# Patient Record
Sex: Female | Born: 1955 | Race: Black or African American | Hispanic: No | Marital: Married | Smoking: Never smoker
Health system: Southern US, Community
[De-identification: ages and names within clinical notes are randomized; demographics above are authoritative.]

## PROBLEM LIST (undated history)

## (undated) DIAGNOSIS — E785 Hyperlipidemia, unspecified: Secondary | ICD-10-CM

## (undated) DIAGNOSIS — I776 Arteritis, unspecified: Secondary | ICD-10-CM

## (undated) DIAGNOSIS — N289 Disorder of kidney and ureter, unspecified: Secondary | ICD-10-CM

## (undated) DIAGNOSIS — I10 Essential (primary) hypertension: Secondary | ICD-10-CM

## (undated) DIAGNOSIS — H3551 Vitreoretinal dystrophy: Secondary | ICD-10-CM

## (undated) HISTORY — DX: Hyperlipidemia, unspecified: E78.5

## (undated) HISTORY — PX: COLONOSCOPY: SHX174

## (undated) HISTORY — PX: RENAL BIOPSY: SHX156

## (undated) HISTORY — DX: Vitreoretinal dystrophy: H35.51

---
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
## 2006-09-27 ENCOUNTER — Ambulatory Visit: Payer: Self-pay

## 2006-10-01 ENCOUNTER — Ambulatory Visit: Payer: Self-pay

## 2006-11-13 ENCOUNTER — Ambulatory Visit: Payer: Self-pay | Admitting: Gastroenterology

## 2009-03-01 ENCOUNTER — Emergency Department: Payer: Self-pay | Admitting: Emergency Medicine

## 2011-08-24 ENCOUNTER — Ambulatory Visit: Payer: Self-pay

## 2012-12-18 DIAGNOSIS — N183 Chronic kidney disease, stage 3 unspecified: Secondary | ICD-10-CM | POA: Insufficient documentation

## 2012-12-18 DIAGNOSIS — N182 Chronic kidney disease, stage 2 (mild): Secondary | ICD-10-CM | POA: Insufficient documentation

## 2012-12-18 DIAGNOSIS — I776 Arteritis, unspecified: Secondary | ICD-10-CM | POA: Insufficient documentation

## 2013-10-09 DIAGNOSIS — A6000 Herpesviral infection of urogenital system, unspecified: Secondary | ICD-10-CM | POA: Insufficient documentation

## 2013-10-09 DIAGNOSIS — M317 Microscopic polyangiitis: Secondary | ICD-10-CM | POA: Insufficient documentation

## 2014-08-31 LAB — HM PAP SMEAR

## 2014-08-31 LAB — HM MAMMOGRAPHY

## 2014-09-07 ENCOUNTER — Ambulatory Visit: Payer: Self-pay

## 2015-01-21 ENCOUNTER — Encounter: Payer: Self-pay | Admitting: Emergency Medicine

## 2015-01-21 ENCOUNTER — Telehealth: Payer: Self-pay | Admitting: Unknown Physician Specialty

## 2015-01-21 ENCOUNTER — Ambulatory Visit
Admission: EM | Admit: 2015-01-21 | Discharge: 2015-01-21 | Disposition: A | Discharge status: Home / Self Care | Payer: BC Managed Care – PPO | Attending: Internal Medicine | Admitting: Internal Medicine

## 2015-01-21 DIAGNOSIS — R6884 Jaw pain: Secondary | ICD-10-CM | POA: Diagnosis present

## 2015-01-21 DIAGNOSIS — R531 Weakness: Secondary | ICD-10-CM

## 2015-01-21 DIAGNOSIS — I10 Essential (primary) hypertension: Secondary | ICD-10-CM | POA: Diagnosis not present

## 2015-01-21 DIAGNOSIS — M542 Cervicalgia: Secondary | ICD-10-CM | POA: Diagnosis present

## 2015-01-21 DIAGNOSIS — K219 Gastro-esophageal reflux disease without esophagitis: Secondary | ICD-10-CM | POA: Insufficient documentation

## 2015-01-21 DIAGNOSIS — M25561 Pain in right knee: Secondary | ICD-10-CM | POA: Insufficient documentation

## 2015-01-21 HISTORY — DX: Arteritis, unspecified: I77.6

## 2015-01-21 HISTORY — DX: Essential (primary) hypertension: I10

## 2015-01-21 MED ORDER — GI COCKTAIL ~~LOC~~
30.0000 mL | Freq: Once | ORAL | Status: AC
  Administered 2015-01-21: 30 mL via ORAL

## 2015-01-21 MED ORDER — RANITIDINE HCL 150 MG PO TABS: 150.0000 mg | ORAL_TABLET | Freq: Two times a day (BID) | ORAL | Status: DC

## 2015-01-21 NOTE — Telephone Encounter (Signed)
Pt called asking for appt for today.  Schedule is full so I was not able to give her one.  Pt stated she had pain behind her ear and in her jaw.  I gave patient the options of future appt or Urgent Care today.  She then stated her BP was 175/102, at that point I told her she needed to be evaluated ASAP.  Pt said she would go to ER or Urgent Care immediately.

## 2015-01-21 NOTE — ED Provider Notes (Signed)
CSN: [id number]     Arrival date & time 01/21/15  8938 History   First MD Initiated Contact with Patient 01/21/15 [phone]     Chief Complaint  Patient presents with  . Jaw Pain  . Neck Pain  . Knee Pain    right knee   (Consider location/radiation/quality/duration/timing/severity/associated sxs/prior Treatment) HPI 59 yo F presents reporting right jaw, right neck,substernal discomfort during the night, worse when supine, improved when upright. No Hx GERD. Under a lot of stress at work. Has vasculitis followed by Dr Leslee Home in [facility]- her knees have been bothering her for 2 weeks and she plans to talk with his office today- has not been able to comfortably do her normal exercise  Hx HTN . Woke up in early morning hours and became concerned about chest discomfort/burning - took her usual AM doses of her BP meds and was able to fall back to sleep. Has had no exertional difficulty this AM. No particular chest discomfort though mid-epigastrium tender to push-no SOB -no exertional discomfort changes.  Called PCP this AM and was advised to come here.  Past Medical History  Diagnosis Date  . Hypertension   . Vasculitis    Past Surgical History  Procedure Laterality Date  . Renal biopsy      2 biopsies, benign tumor 1975  . Breast biopsy     Family History  Problem Relation Age of Onset  . Cancer Mother   . Cancer Father   . Kidney disease Maternal Grandfather    History  Substance Use Topics  . Smoking status: Never Smoker   . Smokeless tobacco: Never Used  . Alcohol Use: No   OB History    No data available     Review of Systems Constitutional: No fever.  Eyes: No visual changes. ENT:No sore throat. Cardiovascular:Positive  for substernal chest discomfort/burning Respiratory: Negative for shortness of breath Gastrointestinal: No abdominal pain. No nausea,vomiting, diarrhea Genitourinary: Negative for dysuria. Normal urination. Musculoskeletal: Negative for back pain.  FROM extremities without pain Skin: Negative for rash Neurological: Negative for headache, focal weakness or numbness  Allergies  Review of patient's allergies indicates no known allergies.  Home Medications   Prior to Admission medications   Medication Sig Start Date End Date Taking? Authorizing Provider  amLODipine (NORVASC) 10 MG tablet Take 10 mg by mouth daily.   Yes Historical Provider, MD  furosemide (LASIX) 20 MG tablet Take 20 mg by mouth daily.   Yes Historical Provider, MD  metoprolol tartrate (LOPRESSOR) 25 MG tablet Take 12.5 mg by mouth 2 (two) times daily.   Yes Historical Provider, MD  ramipril (ALTACE) 10 MG capsule Take 10 mg by mouth daily.   Yes Historical Provider, MD  ranitidine (ZANTAC) 150 MG tablet Take 1 tablet (150 mg total) by mouth 2 (two) times daily. 01/21/15   Jan Fireman, PA-C   BP 141/86 mmHg  Pulse 83  Temp(Src) 97 F (36.1 C) (Tympanic)  Resp 16  Ht 5\' 7"  (1.702 m)  Wt 190 lb (86.183 kg)  BMI 29.75 kg/m2  SpO2 99% Physical Exam   Constitutional -alert and oriented,well appearing and in no acute distress.VSS above Head-atraumatic, normocephalic Eyes- conjunctiva normal, EOMI ,conjugate gaze Nose- no congestion or rhinorrhea Mouth/throat- mucous membranes moist Neck- supple without glandular enlargement CV- regular rate, grossly normal heart sounds, no MGR Resp-no distress, normal respiratory effort,clear to auscultation bilaterally R 16 Back-no CVAT, no spinal tenderness GI- soft,non-tender,no distention, mild ML tenderness with deep  palpation mid-epigastric GU- not examined MSK- no tender, normal ROM, all extremities, ambulatory, self-care Neuro- normal speech and language, no gross focal neurological deficit appreciated, no gait instability, Skin-warm,dry ,intact; no rash noted Psych-mood and affect grossly normal; speech and behavior grossly normal ED Course  Procedures (including critical care time) Labs Review Labs Reviewed - No data  to display  Imaging Review No results found.  EKG: normal EKG, normal sinus rhythm, there are no previous tracings available for comparison, sinus bradycardia.  Rate 59   Reviewed by Dr Valere Dross  Medications  gi cocktail (Maalox,Lidocaine,Donnatal) (30 mLs Oral Given 01/21/15 1000)   Patient  had resolution of mid-epigastric discomfort   1. Gastroesophageal reflux disease without esophagitis     Diagnosis and treatment discussed. Dietary adjustment, stress relievers, intervention with H2 Rx at BID and reduce to QD if without symptoms. Recommend f/u with Dr Ulice Dash -call office today.  Questions fielded, expectations and recommendations reviewed. Patient expresses understanding. Will return to [facility] with questions, concern or exacerbation.   Zantac 150 mg BID Rx ( did not drop )     Jan Fireman, PA-C 01/21/15 1103

## 2015-01-21 NOTE — ED Notes (Signed)
Patient states that around 3:00am this morning she started having pain ion the right side of her jaw and pain in her neck and upper chest area.  Patient states that is started to get better after she took her blood pressure medications around 6:30am this morning.  Patient also reports right knee pain going on for the past 2 weeks.  Patient denies injury.  Patient denies SOB or chest pain at this time.

## 2015-01-21 NOTE — Discharge Instructions (Signed)
Gastroesophageal Reflux Disease, Adult  Gastroesophageal reflux disease (GERD) happens when acid from your stomach flows up into the esophagus. When acid comes in contact with the esophagus, the acid causes soreness (inflammation) in the esophagus. Over time, GERD may create small holes (ulcers) in the lining of the esophagus.  CAUSES   · Increased body weight. This puts pressure on the stomach, making acid rise from the stomach into the esophagus.  · Smoking. This increases acid production in the stomach.  · Drinking alcohol. This causes decreased pressure in the lower esophageal sphincter (valve or ring of muscle between the esophagus and stomach), allowing acid from the stomach into the esophagus.  · Late evening meals and a full stomach. This increases pressure and acid production in the stomach.  · A malformed lower esophageal sphincter.  Sometimes, no cause is found.  SYMPTOMS   · Burning pain in the lower part of the mid-chest behind the breastbone and in the mid-stomach area. This may occur twice a week or more often.  · Trouble swallowing.  · Sore throat.  · Dry cough.  · Asthma-like symptoms including chest tightness, shortness of breath, or wheezing.  DIAGNOSIS   Your caregiver may be able to diagnose GERD based on your symptoms. In some cases, X-rays and other tests may be done to check for complications or to check the condition of your stomach and esophagus.  TREATMENT   Your caregiver may recommend over-the-counter or prescription medicines to help decrease acid production. Ask your caregiver before starting or adding any new medicines.   HOME CARE INSTRUCTIONS   · Change the factors that you can control. Ask your caregiver for guidance concerning weight loss, quitting smoking, and alcohol consumption.  · Avoid foods and drinks that make your symptoms worse, such as:  ¨ Caffeine or alcoholic drinks.  ¨ Chocolate.  ¨ Peppermint or mint flavorings.  ¨ Garlic and onions.  ¨ Spicy foods.  ¨ Citrus fruits,  such as oranges, lemons, or limes.  ¨ Tomato-based foods such as sauce, chili, salsa, and pizza.  ¨ Fried and fatty foods.  · Avoid lying down for the 3 hours prior to your bedtime or prior to taking a nap.  · Eat small, frequent meals instead of large meals.  · Wear loose-fitting clothing. Do not wear anything tight around your waist that causes pressure on your stomach.  · Raise the head of your bed 6 to 8 inches with wood blocks to help you sleep. Extra pillows will not help.  · Only take over-the-counter or prescription medicines for pain, discomfort, or fever as directed by your caregiver.  · Do not take aspirin, ibuprofen, or other nonsteroidal anti-inflammatory drugs (NSAIDs).  SEEK IMMEDIATE MEDICAL CARE IF:   · You have pain in your arms, neck, jaw, teeth, or back.  · Your pain increases or changes in intensity or duration.  · You develop nausea, vomiting, or sweating (diaphoresis).  · You develop shortness of breath, or you faint.  · Your vomit is green, yellow, black, or looks like coffee grounds or blood.  · Your stool is red, bloody, or black.  These symptoms could be signs of other problems, such as heart disease, gastric bleeding, or esophageal bleeding.  MAKE SURE YOU:   · Understand these instructions.  · Will watch your condition.  · Will get help right away if you are not doing well or get worse.  Document Released: 04/11/2005 Document Revised: 09/24/2011 Document Reviewed: 01/19/2011  ExitCare® Patient   Information ©2015 [employer]. This information is not intended to replace advice given to you by your health care provider. Make sure you discuss any questions you have with your health care provider.  Food Choices for Gastroesophageal Reflux Disease  When you have gastroesophageal reflux disease (GERD), the foods you eat and your eating habits are very important. Choosing the right foods can help ease the discomfort of GERD.  WHAT GENERAL GUIDELINES DO I NEED TO FOLLOW?  · Choose fruits,  vegetables, whole grains, low-fat dairy products, and low-fat meat, fish, and poultry.  · Limit fats such as oils, salad dressings, butter, nuts, and avocado.  · Keep a food diary to identify foods that cause symptoms.  · Avoid foods that cause reflux. These may be different for different people.  · Eat frequent small meals instead of three large meals each day.  · Eat your meals slowly, in a relaxed setting.  · Limit fried foods.  · Cook foods using methods other than frying.  · Avoid drinking alcohol.  · Avoid drinking large amounts of liquids with your meals.  · Avoid bending over or lying down until 2-3 hours after eating.  WHAT FOODS ARE NOT RECOMMENDED?  The following are some foods and drinks that may worsen your symptoms:  Vegetables  Tomatoes. Tomato juice. Tomato and spaghetti sauce. Chili peppers. Onion and garlic. Horseradish.  Fruits  Oranges, grapefruit, and lemon (fruit and juice).  Meats  High-fat meats, fish, and poultry. This includes hot dogs, ribs, ham, sausage, salami, and bacon.  Dairy  Whole milk and chocolate milk. Sour cream. Cream. Butter. Ice cream. Cream cheese.   Beverages  Coffee and tea, with or without caffeine. Carbonated beverages or energy drinks.  Condiments  Hot sauce. Barbecue sauce.   Sweets/Desserts  Chocolate and cocoa. Donuts. Peppermint and spearmint.  Fats and Oils  High-fat foods, including French fries and potato chips.  Other  Vinegar. Strong spices, such as black pepper, white pepper, red pepper, cayenne, curry powder, cloves, ginger, and chili powder.  The items listed above may not be a complete list of foods and beverages to avoid. Contact your dietitian for more information.  Document Released: 07/02/2005 Document Revised: 07/07/2013 Document Reviewed: 05/06/2013  ExitCare® Patient Information ©2015 [employer]. This information is not intended to replace advice given to you by your health care provider. Make sure you discuss any questions you have with your  health care provider.

## 2015-06-21 ENCOUNTER — Ambulatory Visit (INDEPENDENT_AMBULATORY_CARE_PROVIDER_SITE_OTHER): Payer: BC Managed Care – PPO | Admitting: Unknown Physician Specialty

## 2015-06-21 ENCOUNTER — Encounter: Payer: Self-pay | Admitting: Unknown Physician Specialty

## 2015-06-21 VITALS — BP 137/90 | HR 90 | Temp 97.3°F | Ht 65.7 in | Wt 193.5 lb

## 2015-06-21 DIAGNOSIS — I10 Essential (primary) hypertension: Secondary | ICD-10-CM | POA: Insufficient documentation

## 2015-06-21 DIAGNOSIS — J069 Acute upper respiratory infection, unspecified: Secondary | ICD-10-CM

## 2015-06-21 DIAGNOSIS — M3130 Wegener's granulomatosis without renal involvement: Secondary | ICD-10-CM

## 2015-06-21 DIAGNOSIS — M8580 Other specified disorders of bone density and structure, unspecified site: Secondary | ICD-10-CM

## 2015-06-21 DIAGNOSIS — J309 Allergic rhinitis, unspecified: Secondary | ICD-10-CM | POA: Insufficient documentation

## 2015-06-21 MED ORDER — BENZONATATE 200 MG PO CAPS: 200.0000 mg | ORAL_CAPSULE | Freq: Two times a day (BID) | ORAL | Status: DC | PRN

## 2015-06-21 MED ORDER — AZITHROMYCIN 250 MG PO TABS: ORAL_TABLET | ORAL | Status: DC

## 2015-06-21 NOTE — Progress Notes (Signed)
BP 137/90 mmHg  Pulse 90  Temp(Src) 97.3 F (36.3 C)  Ht 5' 5.7" (1.669 m)  Wt 193 lb 8 oz (87.771 kg)  BMI 31.51 kg/m2  SpO2 96%   Subjective:    Patient ID: Rebekah Mullins, female    DOB: 01/06/1956, 59 y.o.   MRN: [id number]  HPI: Rebekah Mullins is a 59 y.o. female  Chief Complaint  Patient presents with  . Cough    pt states she has a dry cough that makes her chest hurt when she coughs. States she has been getting some headaches as well as dizziness. Patient states all of this started last Friday.   Cough This is a new problem. The current episode started in the past 7 days. The problem has been gradually worsening. The problem occurs constantly. The cough is non-productive. Associated symptoms include chest pain and headaches. Pertinent negatives include no ear congestion, myalgias, nasal congestion or sore throat. Exacerbated by: talking and laughing better with cough drops. auto immune    Relevant past medical, surgical, family and social history reviewed and updated as indicated. Interim medical history since our last visit reviewed. Allergies and medications reviewed and updated.  Review of Systems  HENT: Negative for sore throat.   Respiratory: Positive for cough.   Cardiovascular: Positive for chest pain.  Musculoskeletal: Negative for myalgias.  Neurological: Positive for headaches.    Per HPI unless specifically indicated above     Objective:    BP 137/90 mmHg  Pulse 90  Temp(Src) 97.3 F (36.3 C)  Ht 5' 5.7" (1.669 m)  Wt 193 lb 8 oz (87.771 kg)  BMI 31.51 kg/m2  SpO2 96%  Wt Readings from Last 3 Encounters:  06/21/15 193 lb 8 oz (87.771 kg)  09/07/14 194 lb (87.998 kg)  01/21/15 190 lb (86.183 kg)    Physical Exam  Constitutional: She is oriented to person, place, and time. She appears well-developed and well-nourished. No distress.  HENT:  Head: Normocephalic and atraumatic.  Right Ear: Tympanic membrane and ear canal normal.  Left Ear:  Tympanic membrane and ear canal normal.  Nose: Rhinorrhea present. Right sinus exhibits no maxillary sinus tenderness and no frontal sinus tenderness. Left sinus exhibits no maxillary sinus tenderness and no frontal sinus tenderness.  Mouth/Throat: Mucous membranes are normal. Posterior oropharyngeal erythema present.  Eyes: Conjunctivae and lids are normal. Right eye exhibits no discharge. Left eye exhibits no discharge. No scleral icterus.  Cardiovascular: Normal rate and regular rhythm.   Pulmonary/Chest: Effort normal and breath sounds normal. No respiratory distress.  Abdominal: Normal appearance. There is no splenomegaly or hepatomegaly.  Musculoskeletal: Normal range of motion.  Neurological: She is alert and oriented to person, place, and time.  Skin: Skin is intact. No rash noted. No pallor.  Psychiatric: She has a normal mood and affect. Her behavior is normal. Judgment and thought content normal.    Results for orders placed or performed in visit on 06/21/15  HM MAMMOGRAPHY  Result Value Ref Range   HM Mammogram from PP   HM PAP SMEAR  Result Value Ref Range   HM Pap smear from PP       Assessment & Plan:   Problem List Items Addressed This Visit    None    Visit Diagnoses    URI (upper respiratory infection)    -  Primary    rx for z pack as pt with an auto immune illness    Relevant Medications  azithromycin (ZITHROMAX) 250 MG tablet         Follow up plan: Return if symptoms worsen or fail to improve.

## 2015-07-16 ENCOUNTER — Encounter: Payer: Self-pay | Admitting: Emergency Medicine

## 2015-07-16 ENCOUNTER — Ambulatory Visit
Admission: EM | Admit: 2015-07-16 | Discharge: 2015-07-16 | Disposition: A | Discharge status: Home / Self Care | Payer: BC Managed Care – PPO | Attending: Family Medicine | Admitting: Family Medicine

## 2015-07-16 DIAGNOSIS — J209 Acute bronchitis, unspecified: Secondary | ICD-10-CM

## 2015-07-16 LAB — CBC WITH DIFFERENTIAL/PLATELET
Basophils Absolute: 0.1 10*3/uL (ref 0–0.1)
Basophils Relative: 1 %
Eosinophils Absolute: 0.4 10*3/uL (ref 0–0.7)
Eosinophils Relative: 3 %
HEMATOCRIT: 35.6 % (ref 35.0–47.0)
HEMOGLOBIN: 12 g/dL (ref 12.0–16.0)
LYMPHS ABS: 1.2 10*3/uL (ref 1.0–3.6)
LYMPHS PCT: 8 %
MCH: 33.1 pg (ref 26.0–34.0)
MCHC: 33.8 g/dL (ref 32.0–36.0)
MCV: 97.9 fL (ref 80.0–100.0)
MONO ABS: 0.6 10*3/uL (ref 0.2–0.9)
MONOS PCT: 4 %
NEUTROS ABS: 12.7 10*3/uL — AB (ref 1.4–6.5)
Neutrophils Relative %: 84 %
Platelets: 369 10*3/uL (ref 150–440)
RBC: 3.64 MIL/uL — ABNORMAL LOW (ref 3.80–5.20)
RDW: 11.9 % (ref 11.5–14.5)
WBC: 14.9 10*3/uL — ABNORMAL HIGH (ref 3.6–11.0)

## 2015-07-16 LAB — RAPID INFLUENZA A&B ANTIGENS
Influenza A (ARMC): NOT DETECTED
Influenza B (ARMC): NOT DETECTED

## 2015-07-16 MED ORDER — LEVOFLOXACIN 500 MG PO TABS: 500.0000 mg | ORAL_TABLET | Freq: Every day | ORAL | Status: DC

## 2015-07-16 NOTE — ED Provider Notes (Signed)
Patient presents today with symptoms of cough, nasal congestion, head/sinus pressure. Patient has had the symptoms for 1 month off and on however her symptoms more recently over the last couple of days have gotten worse. She is on her fifth day of azithromycin which was prescribed by her physician. She denies any chest pain, shortness of breath, nausea, vomiting, diarrhea. She has some yellow-green discharge from the nose and with coughing. She denies any photophobia or neck stiffness. She has a past medical history of vasculitis. She admits to normal kidney function.  ROS: Negative except mentioned above.  Vitals as per Epic.  GENERAL: NAD HEENT: mild pharyngeal erythema, no exudate, mild cerumen left ear, nl TM right ear, no cervical LAD, mild frontal sinus tenderness RESP: CTA B CARD: RRR NEURO: CN II-XII grossly intact   A/P:Bronchitis/Sinusitis- flu screen was negative, CBC shows elevated white blood cell count, would recommend changing her antibiotic to Levaquin. She will stop taking her Z-Pak. She is to continue taking cough suppressant like Delsym. She is to take Tylenol/ibuprofen when necessary for fever/pain. If her symptoms do persist or worsen I do recommend that she follow up with her primary care physician next week. Patient addresses understanding of plan.  Paulina Fusi, MD 07/16/15 1330

## 2015-07-16 NOTE — ED Notes (Signed)
Cough, congested, sneezing, head pressure for 1 month.

## 2015-07-19 ENCOUNTER — Emergency Department: Payer: BC Managed Care – PPO

## 2015-07-19 ENCOUNTER — Encounter: Payer: Self-pay | Admitting: Emergency Medicine

## 2015-07-19 DIAGNOSIS — Z792 Long term (current) use of antibiotics: Secondary | ICD-10-CM | POA: Insufficient documentation

## 2015-07-19 DIAGNOSIS — Z79899 Other long term (current) drug therapy: Secondary | ICD-10-CM | POA: Insufficient documentation

## 2015-07-19 DIAGNOSIS — J209 Acute bronchitis, unspecified: Secondary | ICD-10-CM | POA: Insufficient documentation

## 2015-07-19 DIAGNOSIS — M3130 Wegener's granulomatosis without renal involvement: Secondary | ICD-10-CM | POA: Diagnosis not present

## 2015-07-19 DIAGNOSIS — I10 Essential (primary) hypertension: Secondary | ICD-10-CM | POA: Insufficient documentation

## 2015-07-19 DIAGNOSIS — R05 Cough: Secondary | ICD-10-CM | POA: Diagnosis present

## 2015-07-19 LAB — CBC
HCT: 34.2 % — ABNORMAL LOW (ref 35.0–47.0)
HEMOGLOBIN: 11.5 g/dL — AB (ref 12.0–16.0)
MCH: 32.9 pg (ref 26.0–34.0)
MCHC: 33.8 g/dL (ref 32.0–36.0)
MCV: 97.5 fL (ref 80.0–100.0)
Platelets: 387 10*3/uL (ref 150–440)
RBC: 3.5 MIL/uL — ABNORMAL LOW (ref 3.80–5.20)
RDW: 12.1 % (ref 11.5–14.5)
WBC: 13.3 10*3/uL — ABNORMAL HIGH (ref 3.6–11.0)

## 2015-07-19 NOTE — ED Notes (Signed)
Pt to triage via w/c with no distress noted ; pt c/o sharp pain to left side chest that increased with lying supine accomp by nausea, soreness to head/neck/shoulders/arms; denies hx of same; st recently dx for bronchitis and taking levaquin; st some productive cough yellow sputum

## 2015-07-20 ENCOUNTER — Emergency Department: Payer: BC Managed Care – PPO

## 2015-07-20 ENCOUNTER — Emergency Department
Admission: EM | Admit: 2015-07-20 | Discharge: 2015-07-20 | Disposition: A | Discharge status: Home / Self Care | Payer: BC Managed Care – PPO | Attending: Emergency Medicine | Admitting: Emergency Medicine

## 2015-07-20 DIAGNOSIS — M3130 Wegener's granulomatosis without renal involvement: Secondary | ICD-10-CM

## 2015-07-20 DIAGNOSIS — J209 Acute bronchitis, unspecified: Secondary | ICD-10-CM

## 2015-07-20 HISTORY — DX: Disorder of kidney and ureter, unspecified: N28.9

## 2015-07-20 LAB — URINALYSIS COMPLETE WITH MICROSCOPIC (ARMC ONLY)
BILIRUBIN URINE: NEGATIVE
Bacteria, UA: NONE SEEN
GLUCOSE, UA: NEGATIVE mg/dL
Ketones, ur: NEGATIVE mg/dL
NITRITE: NEGATIVE
Protein, ur: 100 mg/dL — AB
Specific Gravity, Urine: 1.021 (ref 1.005–1.030)
pH: 5 (ref 5.0–8.0)

## 2015-07-20 LAB — BASIC METABOLIC PANEL
Anion gap: 9 (ref 5–15)
BUN: 26 mg/dL — AB (ref 6–20)
CHLORIDE: 106 mmol/L (ref 101–111)
CO2: 23 mmol/L (ref 22–32)
CREATININE: 1.49 mg/dL — AB (ref 0.44–1.00)
Calcium: 9.1 mg/dL (ref 8.9–10.3)
GFR calc Af Amer: 43 mL/min — ABNORMAL LOW (ref >60)
GFR calc non Af Amer: 37 mL/min — ABNORMAL LOW (ref >60)
GLUCOSE: 122 mg/dL — AB (ref 65–99)
POTASSIUM: 4.1 mmol/L (ref 3.5–5.1)
SODIUM: 138 mmol/L (ref 135–145)

## 2015-07-20 LAB — TROPONIN I: Troponin I: <0.03 ng/mL (ref <0.031)

## 2015-07-20 LAB — FIBRIN DERIVATIVES D-DIMER (ARMC ONLY): Fibrin derivatives D-dimer (ARMC): 5088 — ABNORMAL HIGH (ref 0–499)

## 2015-07-20 MED ORDER — GUAIFENESIN ER 600 MG PO TB12: 600.0000 mg | ORAL_TABLET | Freq: Two times a day (BID) | ORAL | Status: DC

## 2015-07-20 MED ORDER — IPRATROPIUM-ALBUTEROL 0.5-2.5 (3) MG/3ML IN SOLN
3.0000 mL | Freq: Once | RESPIRATORY_TRACT | Status: AC
  Administered 2015-07-20: 3 mL via RESPIRATORY_TRACT
  Filled 2015-07-20: qty 3

## 2015-07-20 MED ORDER — PREDNISONE 20 MG PO TABS: 60.0000 mg | ORAL_TABLET | Freq: Every day | ORAL | Status: AC

## 2015-07-20 MED ORDER — PREDNISONE 20 MG PO TABS
60.0000 mg | ORAL_TABLET | Freq: Once | ORAL | Status: AC
  Administered 2015-07-20: 60 mg via ORAL
  Filled 2015-07-20: qty 3

## 2015-07-20 MED ORDER — IOHEXOL 350 MG/ML SOLN
100.0000 mL | Freq: Once | INTRAVENOUS | Status: AC | PRN
  Administered 2015-07-20: 80 mL via INTRAVENOUS

## 2015-07-20 MED ORDER — ALBUTEROL SULFATE HFA 108 (90 BASE) MCG/ACT IN AERS: 2.0000 | INHALATION_SPRAY | Freq: Four times a day (QID) | RESPIRATORY_TRACT | Status: DC | PRN

## 2015-07-20 NOTE — Discharge Instructions (Signed)
Granulomatosis With Polyangiitis Granulomatosis with polyangiitis (GPA), also known as Wegener granulomatosis, is a rare disease of the blood vessels. It causes inflammation of small-sized and medium-sized blood vessels in many parts of the body. This inflammation can interfere with the function of organs that are supplied by the blood vessels. Organs that are commonly affected by this condition include the kidneys, lungs, nose, ears, skin, and eyes. The nervous system is also commonly affected. This condition can range from mild to severe, and it can develop gradually or very quickly (acute). In severe cases, the condition can cause serious damage to organs such as the lungs or kidneys. Early treatment can help to prevent damage to the organs that are involved. CAUSES The cause of this condition is not known. It occurs when a person's defense system (immune system) mistakenly attacks the body's own blood vessels, which causes inflammation and damage. This makes GPA an autoimmune disease. RISK FACTORS This condition is more likely to develop in:  People who are older than 61 years of age.  People who are of Caucasian ethnicity. SYMPTOMS Symptoms can vary depending on which part of the body is involved.  General:  Severe tiredness and weakness.  Decreased appetite.  Unintended weight loss.  Fever or sweats.  Joint pain or swelling.  Muscle pain.  Nose:  Nose or face pain.  Runny nose.  Crusts or sores in the nose.  Nosebleeds.  Airway and lungs:  Cough.  Chest pain.  Change in voice.  Wheezing.  Shortness of breath.  Coughing up blood.  Eyes:  Eye pain.  Red eyes.  Vision problems.  Ears:  Hearing problems. Many other parts of the body can be involved. DIAGNOSIS This condition may be diagnosed with:  A physical exam and medical history.  Blood tests.  Urine tests.  Imaging tests, such as X-rays or a CT scan.  Biopsy. A small tissue sample is  taken from the nasal passages, lung, kidney, skin, or other affected areas for examination in a lab. You may be referred to a specialist. This condition can be confused with a number of other rare diseases. TREATMENT  Aggressive treatment is important for this condition and can lead to remission. GPA is usually treated with:  Steroid medicines, such as prednisone.  Other medicines that prevent or slow down your immune system from causing further damage to your blood vessels. You may also be given antibiotic medicines to prevent infection and to help prevent the disease from returning. In severe cases, you may be given medicine directly into a vein through an IV tube. A type of blood transfusion (plasmapheresis) may also be done to remove immune system cells that are attacking your blood vessels. If the kidneys are badly damaged, a kidney transplant may be recommended. Because relapses are common, treatments may need to be adjusted. HOME CARE INSTRUCTIONS  Take over-the-counter and prescription medicines only as told by your health care provider.  If you were prescribed an antibiotic medicine, take it as told by your health care provider. Do not stop taking the antibiotic even if you start to feel better.  Follow instructions from your health care provider about testing for blood in your urine.  Take vitamins and supplements as told by your health care provider or dietitian.  Follow instructions from your health care provider about diet.Try to eat regular, healthy meals, even though some of your treatments might affect your appetite.  Keep all follow-up visits as told by your health care provider. This  is important. SEEK MEDICAL CARE IF:  Your symptoms do not improve in the time expected.  You develop any new symptoms or problems.  You have a fever.  You have nausea or diarrhea.  You develop a rash.  You have a sore throat, white patches in your mouth, or difficulty  swallowing.  You have severe fatigue.  You develop an infection. SEEK IMMEDIATE MEDICAL CARE IF:  You have chest pain.  You feel short of breath.  You feel very light-headed, or you pass out.  You have pain, swelling, or redness anywhere in your legs.  You have blood in your urine.  You have uncontrollable bleeding, such as a nosebleed that will not stop.  You have sudden loss of vision or hearing.  You have decreased urination.   This information is not intended to replace advice given to you by your health care provider. Make sure you discuss any questions you have with your health care provider.   Document Released: 04/29/2007 Document Revised: 03/23/2015 Document Reviewed: 07/19/2014 Elsevier Interactive Patient Education 2016 [employer]  Acute Bronchitis Bronchitis is inflammation of the airways that extend from the windpipe into the lungs (bronchi). The inflammation often causes mucus to develop. This leads to a cough, which is the most common symptom of bronchitis.  In acute bronchitis, the condition usually develops suddenly and goes away over time, usually in a couple weeks. Smoking, allergies, and asthma can make bronchitis worse. Repeated episodes of bronchitis may cause further lung problems.  CAUSES Acute bronchitis is most often caused by the same virus that causes a cold. The virus can spread from person to person (contagious) through coughing, sneezing, and touching contaminated objects. SIGNS AND SYMPTOMS   Cough.   Fever.   Coughing up mucus.   Body aches.   Chest congestion.   Chills.   Shortness of breath.   Sore throat.  DIAGNOSIS  Acute bronchitis is usually diagnosed through a physical exam. Your health care provider will also ask you questions about your medical history. Tests, such as chest X-rays, are sometimes done to rule out other conditions.  TREATMENT  Acute bronchitis usually goes away in a couple weeks. Oftentimes, no  medical treatment is necessary. Medicines are sometimes given for relief of fever or cough. Antibiotic medicines are usually not needed but may be prescribed in certain situations. In some cases, an inhaler may be recommended to help reduce shortness of breath and control the cough. A cool mist vaporizer may also be used to help thin bronchial secretions and make it easier to clear the chest.  HOME CARE INSTRUCTIONS  Get plenty of rest.   Drink enough fluids to keep your urine clear or pale yellow (unless you have a medical condition that requires fluid restriction). Increasing fluids may help thin your respiratory secretions (sputum) and reduce chest congestion, and it will prevent dehydration.   Take medicines only as directed by your health care provider.  If you were prescribed an antibiotic medicine, finish it all even if you start to feel better.  Avoid smoking and secondhand smoke. Exposure to cigarette smoke or irritating chemicals will make bronchitis worse. If you are a smoker, consider using nicotine gum or skin patches to help control withdrawal symptoms. Quitting smoking will help your lungs heal faster.   Reduce the chances of another bout of acute bronchitis by washing your hands frequently, avoiding people with cold symptoms, and trying not to touch your hands to your mouth, nose, or eyes.  Keep all follow-up visits as directed by your health care provider.  SEEK MEDICAL CARE IF: Your symptoms do not improve after 1 week of treatment.  SEEK IMMEDIATE MEDICAL CARE IF:  You develop an increased fever or chills.   You have chest pain.   You have severe shortness of breath.  You have bloody sputum.   You develop dehydration.  You faint or repeatedly feel like you are going to pass out.  You develop repeated vomiting.  You develop a severe headache. MAKE SURE YOU:   Understand these instructions.  Will watch your condition.  Will get help right away if you  are not doing well or get worse.   This information is not intended to replace advice given to you by your health care provider. Make sure you discuss any questions you have with your health care provider.   Document Released: 08/09/2004 Document Revised: 07/23/2014 Document Reviewed: 12/23/2012 Elsevier Interactive Patient Education [employer].

## 2015-07-20 NOTE — ED Notes (Signed)
Patient transported to CT 

## 2015-07-20 NOTE — ED Provider Notes (Signed)
[facility] Emergency Department Provider Note  ____________________________________________  Time seen: 12:45 AM  I have reviewed the triage vital signs and the nursing notes.   HISTORY  Chief Complaint Chest Pain and Cough     HPI Rebekah Mullins is a 60 y.o. female presents with cough and dyspnea and left-sided chest discomfort and fever  times one month. Patient states that she saw her primary care provider who prescribed her azithromycin and Tessalon Perles without any improvement subsequent patient saw urgent care on 07/16/2015 and was prescribed Levaquin however symptoms are progressively worsened.     Past Medical History  Diagnosis Date  . Hypertension   . Vasculitis [facility])     Patient Active Problem List   Diagnosis Date Noted  . Allergic rhinitis 06/21/2015  . 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 Hypertension 06/21/2015  . Osteopenia 06/21/2015    Past Surgical History  Procedure Laterality Date  . Renal biopsy      2 biopsies, benign tumor 1975  . Breast biopsy      Current Outpatient Rx  Name  Route  Sig  Dispense  Refill  . amLODipine (NORVASC) 10 MG tablet   Oral   Take 10 mg by mouth daily.         . furosemide (LASIX) 20 MG tablet   Oral   Take 20 mg by mouth daily.         Marland Kitchen levofloxacin (LEVAQUIN) 500 MG tablet   Oral   Take 1 tablet (500 mg total) by mouth daily.   7 tablet   0   . metoprolol tartrate (LOPRESSOR) 25 MG tablet   Oral   Take 12.5 mg by mouth 2 (two) times daily.         . ramipril (ALTACE) 10 MG capsule   Oral   Take 10 mg by mouth 2 (two) times daily.          . valACYclovir (VALTREX) 1000 MG tablet   Oral   Take 1,000 mg by mouth daily as needed.         . benzonatate (TESSALON) 200 MG capsule   Oral   Take 1 capsule (200 mg total) by mouth 2 (two) times daily as needed for cough.   20 capsule   0     Allergies Review of patient's allergies indicates no known  allergies.  Family History  Problem Relation Age of Onset  . Cancer Mother     lung  . Cancer Father     bone  . Kidney disease Maternal Grandfather     Social History Social History  Substance Use Topics  . Smoking status: Never Smoker   . Smokeless tobacco: Never Used  . Alcohol Use: No    Review of Systems  Constitutional: Negative for fever. Eyes: Negative for visual changes. ENT: Negative for sore throat. Cardiovascular: Positive for chest pain. Respiratory: Positive productive cough and dyspnea Gastrointestinal: Negative for abdominal pain, vomiting and diarrhea. Genitourinary: Negative for dysuria. Musculoskeletal: Negative for back pain. Skin: Negative for rash. Neurological: Negative for headaches, focal weakness or numbness.   10-point ROS otherwise negative.  ____________________________________________   PHYSICAL EXAM:  VITAL SIGNS: ED Triage Vitals  Enc Vitals Group     BP --      Pulse --      Resp --      Temp --      Temp src --      SpO2 --  Weight --      Height --      Head Cir --      Peak Flow --      Pain Score 07/19/15 2331 8     Pain Loc --      Pain Edu? --      Excl. in [place]? --      Constitutional: Alert and oriented. Apparent discomfort Eyes: Conjunctivae are normal. PERRL. Normal extraocular movements. ENT   Head: Normocephalic and atraumatic.   Nose: No congestion/rhinnorhea.   Mouth/Throat: Mucous membranes are moist.   Neck: No stridor. Hematological/Lymphatic/Immunilogical: No cervical lymphadenopathy. Cardiovascular: Normal rate, regular rhythm. Normal and symmetric distal pulses are present in all extremities. No murmurs, rubs, or gallops. Respiratory: Normal respiratory effort without tachypnea nor retractions. Breath sounds are clear and equal bilaterally. No wheezes/rales/rhonchi. Gastrointestinal: Soft and nontender. No distention. There is no CVA tenderness. Genitourinary:  deferred Musculoskeletal: Nontender with normal range of motion in all extremities. No joint effusions.  No lower extremity tenderness nor edema. Neurologic:  Normal speech and language. No gross focal neurologic deficits are appreciated. Speech is normal.  Skin:  Skin is warm, dry and intact. No rash noted. Psychiatric: Mood and affect are normal. Speech and behavior are normal. Patient exhibits appropriate insight and judgment.  ____________________________________________    LABS (pertinent positives/negatives)  Labs Reviewed  BASIC METABOLIC PANEL - Abnormal; Notable for the following:    Glucose, Bld 122 (*)    BUN 26 (*)    Creatinine, Ser 1.49 (*)    GFR calc non Af Amer 37 (*)    GFR calc Af Amer 43 (*)    All other components within normal limits  CBC - Abnormal; Notable for the following:    WBC 13.3 (*)    RBC 3.50 (*)    Hemoglobin 11.5 (*)    HCT 34.2 (*)    All other components within normal limits  FIBRIN DERIVATIVES D-DIMER ([facility] ONLY) - Abnormal; Notable for the following:    Fibrin derivatives D-dimer [facility]) 5088 (*)    All other components within normal limits  TROPONIN I     ____________________________________________   EKG  ED ECG REPORT I, Azia Toutant, [place], the attending physician, personally viewed and interpreted this ECG.   Date: 07/20/2015  EKG Time: 11:34 PM  Rate: 119  Rhythm: Sinus tachycardia  Axis: None  Intervals: Normal  ST&T Change: None   ____________________________________________    RADIOLOGY    CT Angio Chest PE W/Cm &/Or Wo Cm (Final result) Result time: 07/20/15 02:30:15   Final result by Rad Results In Interface (07/20/15 02:30:15)   Narrative:   CLINICAL DATA: Acute onset of sharp left-sided chest pain, nausea, and head, neck, shoulder and arm soreness. Productive cough. Initial encounter.  EXAM: CT ANGIOGRAPHY CHEST WITH CONTRAST  TECHNIQUE: Multidetector CT imaging of the chest was performed using  the standard protocol during bolus administration of intravenous contrast. Multiplanar CT image reconstructions and MIPs were obtained to evaluate the vascular anatomy.  CONTRAST: 36mL OMNIPAQUE IOHEXOL 350 MG/ML SOLN  COMPARISON: Chest radiograph from 07/19/2015  FINDINGS: There is no evidence of pulmonary embolus.  Numerous nodules are noted at the lower lung lobes, with underlying cystic change and central cavitation of a number of nodules. Mild surrounding ground-glass opacity is seen. A few nodules are also noted at the lung apices, with cystic change on the right. Findings are compatible with the patient's known Wegener's granulomatosis.  There is no evidence of  pleural effusion or pneumothorax.  The mediastinum is unremarkable in appearance. No mediastinal lymphadenopathy is seen. Visualized mediastinal nodes remain borderline normal in size. No pericardial effusion is identified. The great vessels are grossly unremarkable in appearance. No axillary lymphadenopathy is seen. A vague 2.2 cm hypodensity is suggested at the left thyroid lobe.  The visualized portions of the liver and spleen are unremarkable. The visualized portions of the pancreas, gallbladder, stomach, adrenal glands and kidneys are within normal limits.  No acute osseous abnormalities are seen.  Review of the MIP images confirms the above findings.  IMPRESSION: 1. No evidence of pulmonary embolus. 2. Numerous nodules at the lower lung lobes, with underlying cystic change and central cavitation of a number of nodules. Mild surrounding ground-glass opacity seen. Few nodules noted at the lung apices, with cystic change on the right. Findings compatible with the patient's known Wegener's granulomatosis. 3. Vague 2.2 cm hypodensity suggested at the left thyroid lobe. Consider further evaluation with thyroid ultrasound. If patient is clinically hyperthyroid, consider nuclear medicine thyroid uptake and  scan.   Electronically Signed By: Garald Balding M.D. On: 07/20/2015 02:30          DG Chest 2 View (Final result) Result time: 07/20/15 00:09:54   Final result by Rad Results In Interface (07/20/15 00:09:54)   Narrative:   CLINICAL DATA: Sharp pain on the LEFT side.  EXAM: CHEST 2 VIEW  COMPARISON: Radiograph 09/07/2014, CT 03/01/2009  FINDINGS: Normal cardiac silhouette. There is bibasilar opacities which have a reticular pattern and are increased compared to prior. No pleural fluid. No pneumothorax.  IMPRESSION: 1. Increasing reticular opacities at the lung bases correspond to bronchiectasis and cystic changes at the lung base on comparison CT 2010. Findings have progressed. Consider nonemergent high-resolution CT of the chest. 2. No acute findings evident.   Electronically Signed By: Suzy Bouchard M.D. On: 07/20/2015 00:09              INITIAL IMPRESSION / ASSESSMENT AND PLAN / ED COURSE  Pertinent labs & imaging results that were available during my care of the patient were reviewed by me and considered in my medical decision making (see chart for details).  CT scan findings,  as well as protein and hemoglobin noted in the urine plus leukocytosis raises a concern of possible reemergence of Wegener's. I informed the patient of all clinical findings and concern regarding her Wegener's. Patient is advised to follow-up with her nephrologist at [facility] today. Patient be prescribed prednisone as well as Mucinex. 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

## 2015-07-20 NOTE — ED Notes (Signed)
E signature not working.  Pt received dc papers, prescriptions and copies of radiology and lab results.  Verbalized understanding.  Skin w/d, ambulates well.

## 2015-08-22 DIAGNOSIS — R079 Chest pain, unspecified: Secondary | ICD-10-CM | POA: Insufficient documentation

## 2015-11-02 ENCOUNTER — Encounter: Payer: Self-pay | Admitting: *Deleted

## 2015-11-16 ENCOUNTER — Encounter: Payer: Self-pay | Admitting: General Surgery

## 2015-11-16 ENCOUNTER — Ambulatory Visit (INDEPENDENT_AMBULATORY_CARE_PROVIDER_SITE_OTHER): Payer: BC Managed Care – PPO | Admitting: General Surgery

## 2015-11-16 VITALS — BP 130/74 | HR 82 | Resp 12 | Ht 66.0 in | Wt 202.0 lb

## 2015-11-16 DIAGNOSIS — Z1211 Encounter for screening for malignant neoplasm of colon: Secondary | ICD-10-CM

## 2015-11-16 MED ORDER — POLYETHYLENE GLYCOL 3350 17 GM/SCOOP PO POWD: ORAL | Status: DC

## 2015-11-16 NOTE — Patient Instructions (Addendum)
The patient is aware to call back for any questions or concerns. Colonoscopy A colonoscopy is an exam to look at the entire large intestine (colon). This exam can help find problems such as tumors, polyps, inflammation, and areas of bleeding. The exam takes about 1 hour.  LET [facility] CARE PROVIDER KNOW ABOUT:   Any allergies you have.  All medicines you are taking, including vitamins, herbs, eye drops, creams, and over-the-counter medicines.  Previous problems you or members of your family have had with the use of anesthetics.  Any blood disorders you have.  Previous surgeries you have had.  Medical conditions you have. RISKS AND COMPLICATIONS  Generally, this is a safe procedure. However, as with any procedure, complications can occur. Possible complications include:  Bleeding.  Tearing or rupture of the colon wall.  Reaction to medicines given during the exam.  Infection (rare). BEFORE THE PROCEDURE   Ask your health care provider about changing or stopping your regular medicines.  You may be prescribed an oral bowel prep. This involves drinking a large amount of medicated liquid, starting the day before your procedure. The liquid will cause you to have multiple loose stools until your stool is almost clear or light green. This cleans out your colon in preparation for the procedure.  Do not eat or drink anything else once you have started the bowel prep, unless your health care provider tells you it is safe to do so.  Arrange for someone to drive you home after the procedure. PROCEDURE   You will be given medicine to help you relax (sedative).  You will lie on your side with your knees bent.  A long, flexible tube with a light and camera on the end (colonoscope) will be inserted through the rectum and into the colon. The camera sends video back to a computer screen as it moves through the colon. The colonoscope also releases carbon dioxide gas to inflate the colon. This  helps your health care provider see the area better.  During the exam, your health care provider may take a small tissue sample (biopsy) to be examined under a microscope if any abnormalities are found.  The exam is finished when the entire colon has been viewed. AFTER THE PROCEDURE   Do not drive for 24 hours after the exam.  You may have a small amount of blood in your stool.  You may pass moderate amounts of gas and have mild abdominal cramping or bloating. This is caused by the gas used to inflate your colon during the exam.  Ask when your test results will be ready and how you will get your results. Make sure you get your test results.   This information is not intended to replace advice given to you by your health care provider. Make sure you discuss any questions you have with your health care provider.   Document Released: 06/29/2000 Document Revised: 04/22/2013 Document Reviewed: 03/09/2013 Elsevier Interactive Patient Education [employer].  Patient has been scheduled for a colonoscopy on 12-07-15 at [facility]

## 2015-11-16 NOTE — Progress Notes (Signed)
Patient ID: Rebekah Mullins, female   DOB: December 17, 1955, 60 y.o.   MRN: [id number]  Chief Complaint  Patient presents with  . Colonoscopy    HPI Rebekah Mullins is a 60 y.o. female.  Who presents for a colonoscopy discussion. The last colonoscopy was completed 10 years ago. Denies any gastrointestinal issues. Bowels move regular and no bleeding noted. I have reviewed the history of present illness with the patient.  HPI  Past Medical History  Diagnosis Date  . Hypertension   . Vasculitis (Misenheimer)   . Renal insufficiency   . Wagner syndrome     Past Surgical History  Procedure Laterality Date  . Renal biopsy  2002, 2005    2 biopsies  . Breast biopsy Right 1975  . Colonoscopy      Family History  Problem Relation Age of Onset  . Cancer Mother     lung  . Cancer Father     bone  . Kidney disease Maternal Grandfather     Social History Social History  Substance Use Topics  . Smoking status: Never Smoker   . Smokeless tobacco: Never Used  . Alcohol Use: No    No Known Allergies  Current Outpatient Prescriptions  Medication Sig Dispense Refill  . acetaminophen (TYLENOL) 325 MG tablet Take 650 mg by mouth every 6 (six) hours as needed.    Marland Kitchen amLODipine (NORVASC) 10 MG tablet Take 10 mg by mouth daily.    . furosemide (LASIX) 20 MG tablet Take 20 mg by mouth daily.    . metoprolol tartrate (LOPRESSOR) 25 MG tablet Take 12.5 mg by mouth 2 (two) times daily.    . predniSONE (DELTASONE) 10 MG tablet Take 20 mg by mouth.    . ramipril (ALTACE) 10 MG capsule Take 10 mg by mouth 2 (two) times daily.     . Simethicone (GAS RELIEF PO) Take by mouth as needed.    . valACYclovir (VALTREX) 1000 MG tablet Take 1,000 mg by mouth daily as needed.    . benzonatate (TESSALON) 200 MG capsule Take 1 capsule (200 mg total) by mouth 2 (two) times daily as needed for cough. 20 capsule 0  . polyethylene glycol powder (GLYCOLAX/MIRALAX) powder 255 grams one bottle for colonoscopy prep 255 g 0    No current facility-administered medications for this visit.    Review of Systems Review of Systems  Constitutional: Negative.   Respiratory: Negative.   Cardiovascular: Negative.     Blood pressure 130/74, pulse 82, resp. rate 12, height 5\' 6"  (1.676 m), weight 202 lb (91.627 kg).  Physical Exam Physical Exam  Constitutional: She is oriented to person, place, and time. Vital signs are normal. She appears well-developed and well-nourished.  HENT:  Mouth/Throat: Oropharynx is clear and moist.  Eyes: Conjunctivae are normal. No scleral icterus.  Neck: Neck supple.  Cardiovascular: Normal rate, regular rhythm and normal heart sounds.   Pulmonary/Chest: Effort normal and breath sounds normal.  Abdominal: Soft. Normal appearance. She exhibits no distension. There is no tenderness.  Lymphadenopathy:    She has no cervical adenopathy.  Neurological: She is alert and oriented to person, place, and time.  Skin: Skin is warm and dry.  Psychiatric: Her behavior is normal.    Data Reviewed Progress Notes  Assessment    Stable exam    Plan    Colonoscopy with possible biopsy/polypectomy prn: Information regarding the procedure, including its potential risks and complications (including but not limited to perforation of the bowel,  which may require emergency surgery to repair, and bleeding) was verbally given to the patient. Educational information regarding lower intestinal endoscopy was given to the patient. Written instructions for how to complete the bowel prep using Miralax were provided. The importance of drinking ample fluids to avoid dehydration as a result of the prep emphasized.  Patient has been scheduled for a colonoscopy on 12-07-15 at [facility].     PCP:  Kathrine Haddock Refer Dr. Laurey Morale This information has been scribed by Karie Fetch RN, BSN,BC.    Bianca Raneri G 11/16/2015, 3:05 PM

## 2015-12-07 ENCOUNTER — Ambulatory Visit
Admission: RE | Admit: 2015-12-07 | Discharge: 2015-12-07 | Disposition: A | Discharge status: Home / Self Care | Payer: BC Managed Care – PPO | Source: Ambulatory Visit | Attending: General Surgery | Admitting: General Surgery

## 2015-12-07 ENCOUNTER — Ambulatory Visit: Payer: BC Managed Care – PPO | Admitting: Anesthesiology

## 2015-12-07 ENCOUNTER — Encounter: Payer: Self-pay | Admitting: Anesthesiology

## 2015-12-07 ENCOUNTER — Encounter
Admission: RE | Disposition: A | Discharge status: Home / Self Care | Payer: Self-pay | Source: Ambulatory Visit | Attending: General Surgery

## 2015-12-07 DIAGNOSIS — Z1211 Encounter for screening for malignant neoplasm of colon: Secondary | ICD-10-CM

## 2015-12-07 DIAGNOSIS — K5730 Diverticulosis of large intestine without perforation or abscess without bleeding: Secondary | ICD-10-CM | POA: Insufficient documentation

## 2015-12-07 DIAGNOSIS — K635 Polyp of colon: Secondary | ICD-10-CM | POA: Diagnosis not present

## 2015-12-07 DIAGNOSIS — Z6831 Body mass index (BMI) 31.0-31.9, adult: Secondary | ICD-10-CM | POA: Insufficient documentation

## 2015-12-07 DIAGNOSIS — K621 Rectal polyp: Secondary | ICD-10-CM

## 2015-12-07 DIAGNOSIS — D125 Benign neoplasm of sigmoid colon: Secondary | ICD-10-CM | POA: Diagnosis not present

## 2015-12-07 DIAGNOSIS — Z7952 Long term (current) use of systemic steroids: Secondary | ICD-10-CM | POA: Insufficient documentation

## 2015-12-07 DIAGNOSIS — I10 Essential (primary) hypertension: Secondary | ICD-10-CM | POA: Insufficient documentation

## 2015-12-07 DIAGNOSIS — E669 Obesity, unspecified: Secondary | ICD-10-CM | POA: Diagnosis not present

## 2015-12-07 DIAGNOSIS — Z79899 Other long term (current) drug therapy: Secondary | ICD-10-CM | POA: Diagnosis not present

## 2015-12-07 HISTORY — PX: COLONOSCOPY WITH PROPOFOL: SHX5780

## 2015-12-07 SURGERY — COLONOSCOPY WITH PROPOFOL
Anesthesia: General

## 2015-12-07 MED ORDER — PHENYLEPHRINE HCL 10 MG/ML IJ SOLN
INTRAMUSCULAR | Status: DC | PRN
  Administered 2015-12-07: 100 ug via INTRAVENOUS

## 2015-12-07 MED ORDER — FENTANYL CITRATE (PF) 100 MCG/2ML IJ SOLN
INTRAMUSCULAR | Status: DC | PRN
  Administered 2015-12-07: 50 ug via INTRAVENOUS

## 2015-12-07 MED ORDER — SODIUM CHLORIDE 0.9 % IV SOLN
INTRAVENOUS | Status: DC
  Administered 2015-12-07: 09:00:00 via INTRAVENOUS

## 2015-12-07 MED ORDER — MIDAZOLAM HCL 2 MG/2ML IJ SOLN
INTRAMUSCULAR | Status: DC | PRN
  Administered 2015-12-07: 1 mg via INTRAVENOUS

## 2015-12-07 MED ORDER — PROPOFOL 10 MG/ML IV BOLUS
INTRAVENOUS | Status: DC | PRN
  Administered 2015-12-07: 30 mg via INTRAVENOUS
  Administered 2015-12-07: 50 mg via INTRAVENOUS

## 2015-12-07 MED ORDER — PROPOFOL 500 MG/50ML IV EMUL
INTRAVENOUS | Status: DC | PRN
  Administered 2015-12-07: 180 ug/kg/min via INTRAVENOUS

## 2015-12-07 MED ORDER — LIDOCAINE HCL (CARDIAC) 20 MG/ML IV SOLN
INTRAVENOUS | Status: DC | PRN
  Administered 2015-12-07: 40 mg via INTRAVENOUS

## 2015-12-07 NOTE — Anesthesia Postprocedure Evaluation (Signed)
Anesthesia Post Note  Patient: Rebekah Mullins  Procedure(s) Performed: Procedure(s) (LRB): COLONOSCOPY WITH PROPOFOL (N/A)  Patient location during evaluation: PACU Anesthesia Type: General Level of consciousness: awake and alert Pain management: pain level controlled Vital Signs Assessment: post-procedure vital signs reviewed and stable Respiratory status: spontaneous breathing, nonlabored ventilation, respiratory function stable and patient connected to nasal cannula oxygen Cardiovascular status: blood pressure returned to baseline and stable Postop Assessment: no signs of nausea or vomiting Anesthetic complications: no    Last Vitals:  Filed Vitals:   12/07/15 0951 12/07/15 1001  BP: 90/62 109/66  Pulse: 82 70  Temp:    Resp: 16 14    Last Pain: There were no vitals filed for this visit.               Aljean Horiuchi S

## 2015-12-07 NOTE — Transfer of Care (Signed)
Immediate Anesthesia Transfer of Care Note  Patient: Rebekah Mullins  Procedure(s) Performed: Procedure(s): COLONOSCOPY WITH PROPOFOL (N/A)  Patient Location: PACU and Endoscopy Unit  Anesthesia Type:General  Level of Consciousness: sedated  Airway & Oxygen Therapy: Patient Spontanous Breathing and Patient connected to nasal cannula oxygen  Post-op Assessment: Report given to RN and Post -op Vital signs reviewed and stable  Post vital signs: Reviewed and stable  Last Vitals:  Filed Vitals:   12/07/15 0833 12/07/15 0941  BP: 155/90 94/54  Pulse: 77 85  Temp: 36.4 C 36.1 C  Resp: 17 12    Complications: No apparent anesthesia complications

## 2015-12-07 NOTE — Interval H&P Note (Signed)
History and Physical Interval Note:  12/07/2015 8:28 AM  Rebekah Mullins  has presented today for surgery, with the diagnosis of SCREENING  The various methods of treatment have been discussed with the patient and family. After consideration of risks, benefits and other options for treatment, the patient has consented to  Procedure(s): COLONOSCOPY WITH PROPOFOL (N/A) as a surgical intervention .  The patient's history has been reviewed, patient examined, no change in status, stable for surgery.  I have reviewed the patient's chart and labs.  Questions were answered to the patient's satisfaction.     Newt Levingston G

## 2015-12-07 NOTE — Addendum Note (Signed)
Addendum  created 12/07/15 1111 by Doreen Salvage, CRNA   Modules edited: Anesthesia Medication Administration

## 2015-12-07 NOTE — Anesthesia Procedure Notes (Signed)
Date/Time: 12/07/2015 9:09 AM Performed by: Doreen Salvage Pre-anesthesia Checklist: Patient identified, Emergency Drugs available, Suction available and Patient being monitored Patient Re-evaluated:Patient Re-evaluated prior to inductionOxygen Delivery Method: Nasal cannula Intubation Type: IV induction Dental Injury: Teeth and Oropharynx as per pre-operative assessment  Comments: Nasal cannula with etCO2 monitoring

## 2015-12-07 NOTE — Anesthesia Preprocedure Evaluation (Addendum)
Anesthesia Evaluation  Patient identified by MRN, date of birth, ID band Patient awake    Reviewed: Allergy & Precautions, NPO status , Patient's Chart, lab work & pertinent test results, reviewed documented beta blocker date and time   Airway Mallampati: II  TM Distance: >3 FB     Dental  (+) Chipped   Pulmonary           Cardiovascular hypertension, Pt. on medications and Pt. on home beta blockers      Neuro/Psych    GI/Hepatic   Endo/Other    Renal/GU Renal InsufficiencyRenal disease     Musculoskeletal   Abdominal   Peds  Hematology   Anesthesia Other Findings Obese. Has been anemic in the past Hb 11.5.  Reproductive/Obstetrics                            Anesthesia Physical Anesthesia Plan  ASA: III  Anesthesia Plan: General   Post-op Pain Management:    Induction: Intravenous  Airway Management Planned: Nasal Cannula  Additional Equipment:   Intra-op Plan:   Post-operative Plan:   Informed Consent: I have reviewed the patients History and Physical, chart, labs and discussed the procedure including the risks, benefits and alternatives for the proposed anesthesia with the patient or authorized representative who has indicated his/her understanding and acceptance.     Plan Discussed with: CRNA  Anesthesia Plan Comments:         Anesthesia Quick Evaluation

## 2015-12-07 NOTE — Op Note (Signed)
[facility] Patient Name: Rebekah Mullins Procedure Date: 12/07/2015 9:09 AM MRN: [id number] Account #: [id number] Date of Birth: [birth year] Admit Type: Outpatient Age: 60 Room: [facility] ENDO ROOM 1 Gender: Female Note Status: Finalized Procedure:            Colonoscopy Indications:          Screening for colorectal malignant neoplasm Providers:            Tanika Bracco G. Jamal Collin, MD Referring MD:         Kathrine Haddock, PA (Referring MD) Medicines:            General Anesthesia Complications:        No immediate complications. Procedure:            Pre-Anesthesia Assessment:                       - General anesthesia under the supervision of an                        anesthesiologist was determined to be medically                        necessary for this procedure based on review of the                        patient's medical history, medications, and prior                        anesthesia history.                       After obtaining informed consent, the colonoscope was                        passed under direct vision. Throughout the procedure,                        the patient's blood pressure, pulse, and oxygen                        saturations were monitored continuously. The                        Colonoscope was introduced through the anus and                        advanced to the the cecum, identified by the ileocecal                        valve. The colonoscopy was performed without                        difficulty. The quality of the bowel preparation was                        good. Findings:      The perianal and digital rectal examinations were normal.      A 5 mm polyp was found in the rectum. The polyp was sessile. The polyp       was removed with a hot snare. Resection and retrieval were complete.  A 3 mm polyp was found in the distal sigmoid colon. The polyp was       sessile. The polyp was removed with a cold biopsy  forceps. Resection and       retrieval were complete.      A few small-mouthed diverticula were found in the sigmoid colon.      The exam was otherwise without abnormality on direct and retroflexion       views. Impression:           - One 5 mm polyp in the rectum, removed with a hot                        snare. Resected and retrieved.                       - One 3 mm polyp in the distal sigmoid colon, removed                        with a cold biopsy forceps. Resected and retrieved.                       - Diverticulosis in the sigmoid colon.                       - The examination was otherwise normal on direct and                        retroflexion views. Recommendation:       - Await pathology results.                       - Repeat colonoscopy in 5 years for surveillance. Procedure Code(s):    --- Professional ---                       [phone], Colonoscopy, flexible; with removal of tumor(s),                        polyp(s), or other lesion(s) by snare technique                       [id number], 78, Colonoscopy, flexible; with biopsy, single                        or multiple Diagnosis Code(s):    --- Professional ---                       Z12.11, Encounter for screening for malignant neoplasm                        of colon                       K62.1, Rectal polyp                       D12.5, Benign neoplasm of sigmoid colon                       K57.30, Diverticulosis of large intestine without  perforation or abscess without bleeding CPT copyright 2016 American Medical Association. All rights reserved. The codes documented in this report are preliminary and upon coder review may  be revised to meet current compliance requirements. Christene Lye, MD 12/07/2015 9:42:33 AM This report has been signed electronically. Number of Addenda: 0 Note Initiated On: 12/07/2015 9:09 AM Scope Withdrawal Time: 0 hours 5 minutes 2 seconds  Total Procedure Duration: 0  hours 23 minutes 44 seconds       [facility]

## 2015-12-07 NOTE — H&P (View-Only) (Signed)
Patient ID: Rebekah Mullins, female   DOB: December 17, 1955, 60 y.o.   MRN: [id number]  Chief Complaint  Patient presents with  . Colonoscopy    HPI Rebekah Mullins is a 60 y.o. female.  Who presents for a colonoscopy discussion. The last colonoscopy was completed 10 years ago. Denies any gastrointestinal issues. Bowels move regular and no bleeding noted. I have reviewed the history of present illness with the patient.  HPI  Past Medical History  Diagnosis Date  . Hypertension   . Vasculitis (Misenheimer)   . Renal insufficiency   . Wagner syndrome     Past Surgical History  Procedure Laterality Date  . Renal biopsy  2002, 2005    2 biopsies  . Breast biopsy Right 1975  . Colonoscopy      Family History  Problem Relation Age of Onset  . Cancer Mother     lung  . Cancer Father     bone  . Kidney disease Maternal Grandfather     Social History Social History  Substance Use Topics  . Smoking status: Never Smoker   . Smokeless tobacco: Never Used  . Alcohol Use: No    No Known Allergies  Current Outpatient Prescriptions  Medication Sig Dispense Refill  . acetaminophen (TYLENOL) 325 MG tablet Take 650 mg by mouth every 6 (six) hours as needed.    Marland Kitchen amLODipine (NORVASC) 10 MG tablet Take 10 mg by mouth daily.    . furosemide (LASIX) 20 MG tablet Take 20 mg by mouth daily.    . metoprolol tartrate (LOPRESSOR) 25 MG tablet Take 12.5 mg by mouth 2 (two) times daily.    . predniSONE (DELTASONE) 10 MG tablet Take 20 mg by mouth.    . ramipril (ALTACE) 10 MG capsule Take 10 mg by mouth 2 (two) times daily.     . Simethicone (GAS RELIEF PO) Take by mouth as needed.    . valACYclovir (VALTREX) 1000 MG tablet Take 1,000 mg by mouth daily as needed.    . benzonatate (TESSALON) 200 MG capsule Take 1 capsule (200 mg total) by mouth 2 (two) times daily as needed for cough. 20 capsule 0  . polyethylene glycol powder (GLYCOLAX/MIRALAX) powder 255 grams one bottle for colonoscopy prep 255 Mullins 0    No current facility-administered medications for this visit.    Review of Systems Review of Systems  Constitutional: Negative.   Respiratory: Negative.   Cardiovascular: Negative.     Blood pressure 130/74, pulse 82, resp. rate 12, height 5\' 6"  (1.676 m), weight 202 lb (91.627 kg).  Physical Exam Physical Exam  Constitutional: She is oriented to person, place, and time. Vital signs are normal. She appears well-developed and well-nourished.  HENT:  Mouth/Throat: Oropharynx is clear and moist.  Eyes: Conjunctivae are normal. No scleral icterus.  Neck: Neck supple.  Cardiovascular: Normal rate, regular rhythm and normal heart sounds.   Pulmonary/Chest: Effort normal and breath sounds normal.  Abdominal: Soft. Normal appearance. She exhibits no distension. There is no tenderness.  Lymphadenopathy:    She has no cervical adenopathy.  Neurological: She is alert and oriented to person, place, and time.  Skin: Skin is warm and dry.  Psychiatric: Her behavior is normal.    Data Reviewed Progress Notes  Assessment    Stable exam    Plan    Colonoscopy with possible biopsy/polypectomy prn: Information regarding the procedure, including its potential risks and complications (including but not limited to perforation of the bowel,  which may require emergency surgery to repair, and bleeding) was verbally given to the patient. Educational information regarding lower intestinal endoscopy was given to the patient. Written instructions for how to complete the bowel prep using Miralax were provided. The importance of drinking ample fluids to avoid dehydration as a result of the prep emphasized.  Patient has been scheduled for a colonoscopy on 12-07-15 at [facility].     PCP:  Kathrine Haddock Refer Dr. Laurey Morale This information has been scribed by Karie Fetch RN, BSN,BC.    Rebekah Mullins 11/16/2015, 3:05 PM

## 2015-12-08 LAB — SURGICAL PATHOLOGY

## 2015-12-09 ENCOUNTER — Encounter: Payer: Self-pay | Admitting: General Surgery

## 2015-12-13 ENCOUNTER — Telehealth: Payer: Self-pay | Admitting: *Deleted

## 2015-12-13 NOTE — Telephone Encounter (Signed)
-----   Message from Christene Lye, MD sent at 12/10/2015  9:05 AM EDT ----- Milana Obey let pt pt know the pathology was normal. The  polyps were not true polyps. Good for 10 yrs

## 2015-12-13 NOTE — Telephone Encounter (Signed)
Placed in recalls. Notified patient as instructed, patient pleased. Discussed follow-up appointments, patient agrees

## 2016-04-30 ENCOUNTER — Encounter: Payer: Self-pay | Admitting: Unknown Physician Specialty

## 2016-04-30 ENCOUNTER — Ambulatory Visit (INDEPENDENT_AMBULATORY_CARE_PROVIDER_SITE_OTHER): Payer: BC Managed Care – PPO | Admitting: Unknown Physician Specialty

## 2016-04-30 VITALS — BP 139/83 | HR 101 | Temp 98.2°F | Ht 66.4 in | Wt 212.6 lb

## 2016-04-30 DIAGNOSIS — R600 Localized edema: Secondary | ICD-10-CM | POA: Diagnosis not present

## 2016-04-30 DIAGNOSIS — B029 Zoster without complications: Secondary | ICD-10-CM

## 2016-04-30 MED ORDER — VALACYCLOVIR HCL 1 G PO TABS: 1000.0000 mg | ORAL_TABLET | Freq: Three times a day (TID) | ORAL | 0 refills | Status: AC

## 2016-04-30 NOTE — Progress Notes (Signed)
BP 139/83 (BP Location: Left Arm, Patient Position: Sitting, Cuff Size: Normal)   Pulse (!) 101   Temp 98.2 F (36.8 C)   Ht 5' 6.4" (1.687 m)   Wt 212 lb 9.6 oz (96.4 kg)   SpO2 97%   BMI 33.90 kg/m    Subjective:    Patient ID: Rebekah Mullins, female    DOB: [birth year], 60 y.o.   MRN: [id number]  HPI: Rebekah Mullins is a 60 y.o. female  Chief Complaint  Patient presents with  . Rash    pt states she has a rash on right side of her forehead. States it came up Friday, and she had a headache after.  . Foot Swelling    pt states both of her feet having been swelling for a couple of months now   . Medication Refill    pt states she needs a refill on valtrex    Shingles Pt with vesicular rash right side of face.  She had sharp stabbing pain 2 days ago on right side of head but it has subsided.  Also had some pain in eye but no visual changes.  She is taking Valtrex on occasion for genital herpes    Swelling Pt states she is having foot swelling.  Under the care of [facility].  Already on 40 mg of Lasix.     Relevant past medical, surgical, family and social history reviewed and updated as indicated. Interim medical history since our last visit reviewed. Allergies and medications reviewed and updated.  Review of Systems  Per HPI unless specifically indicated above     Objective:    BP 139/83 (BP Location: Left Arm, Patient Position: Sitting, Cuff Size: Normal)   Pulse (!) 101   Temp 98.2 F (36.8 C)   Ht 5' 6.4" (1.687 m)   Wt 212 lb 9.6 oz (96.4 kg)   SpO2 97%   BMI 33.90 kg/m   Wt Readings from Last 3 Encounters:  04/30/16 212 lb 9.6 oz (96.4 kg)  12/07/15 192 lb (87.1 kg)  11/16/15 202 lb (91.6 kg)    Physical Exam  Constitutional: She is oriented to person, place, and time. She appears well-developed and well-nourished. No distress.  HENT:  Head: Normocephalic and atraumatic.  Eyes: Conjunctivae and lids are normal. Right eye exhibits no discharge.  Left eye exhibits no discharge. No scleral icterus.  Neck: Normal range of motion. Neck supple. No JVD present. Carotid bruit is not present.  Cardiovascular: Normal rate, regular rhythm and normal heart sounds.   Pulmonary/Chest: Effort normal and breath sounds normal.  Abdominal: Normal appearance. There is no splenomegaly or hepatomegaly.  Musculoskeletal: Normal range of motion.  Neurological: She is alert and oriented to person, place, and time.  Skin: Skin is warm, dry and intact. No rash noted. No pallor.  Vesicular rash right side of face.    Psychiatric: She has a normal mood and affect. Her behavior is normal. Judgment and thought content normal.       Assessment & Plan:   Problem List Items Addressed This Visit    None    Visit Diagnoses    Herpes zoster without complication    -  Primary   Refill Valtrex and take one TID for 1 week and then prn.  Pt will make appointment with Optomerist for further evaluation of right eye   Relevant Medications   valACYclovir (VALTREX) 1000 MG tablet   Edema of both feet  Probably related to Amlodipine plus Prednisone.  Call Nephrologist to see about dose adjustments.  Discussed compressin stockings.         Follow up plan: Return if symptoms worsen or fail to improve.

## 2016-04-30 NOTE — Patient Instructions (Signed)
Take Valtrex 3 times/day for 1 week.   Make appt with eye care professional.

## 2016-11-14 ENCOUNTER — Ambulatory Visit: Payer: BC Managed Care – PPO | Admitting: Obstetrics and Gynecology

## 2016-11-28 ENCOUNTER — Encounter: Payer: Self-pay | Admitting: Advanced Practice Midwife

## 2016-11-28 ENCOUNTER — Ambulatory Visit (INDEPENDENT_AMBULATORY_CARE_PROVIDER_SITE_OTHER): Payer: BC Managed Care – PPO | Admitting: Advanced Practice Midwife

## 2016-11-28 VITALS — BP 134/84 | Ht 66.0 in | Wt 211.0 lb

## 2016-11-28 DIAGNOSIS — Z131 Encounter for screening for diabetes mellitus: Secondary | ICD-10-CM

## 2016-11-28 DIAGNOSIS — Z124 Encounter for screening for malignant neoplasm of cervix: Secondary | ICD-10-CM | POA: Diagnosis not present

## 2016-11-28 DIAGNOSIS — Z01419 Encounter for gynecological examination (general) (routine) without abnormal findings: Secondary | ICD-10-CM | POA: Diagnosis not present

## 2016-11-28 NOTE — Progress Notes (Signed)
Gynecology Annual Exam  PCP: Kathrine Haddock, NP  Chief Complaint:  Chief Complaint  Patient presents with  . Annual Exam    History of Present Illness:Patient is a 61 y.o. G4P4 presents for annual exam. The patient has complaints today of congestion and allergy symptoms. She would also like to be screened for diabetes.  LMP: No LMP recorded. Patient is postmenopausal.   The patient is not sexually active. She denies dyspareunia.  The patient does perform self breast exams.  There is no notable family history of breast or ovarian cancer in her family.  The patient wears seatbelts: yes.   The patient has regular exercise: yes.    The patient denies current symptoms of depression.     Review of Systems: Review of Systems  Constitutional: Negative.   HENT: Negative.   Eyes: Negative.   Respiratory: Negative.   Cardiovascular: Negative.   Gastrointestinal: Negative.   Genitourinary: Negative.   Musculoskeletal: Negative.   Skin: Negative.   Neurological: Negative.   Endo/Heme/Allergies: Negative.   Psychiatric/Behavioral: Negative.     Past Medical History:  Past Medical History:  Diagnosis Date  . Hypertension   . Renal insufficiency   . Vasculitis ([place])   . Wagner syndrome     Past Surgical History:  Past Surgical History:  Procedure Laterality Date  . BREAST BIOPSY Right 1975  . COLONOSCOPY    . COLONOSCOPY WITH PROPOFOL N/A 12/07/2015   Procedure: COLONOSCOPY WITH PROPOFOL;  Surgeon: Christene Lye, MD;  Location: [facility] ENDOSCOPY;  Service: Endoscopy;  Laterality: N/A;  . RENAL BIOPSY  2002, 2005   2 biopsies    Gynecologic History:  No LMP recorded. Patient is postmenopausal. Last Pap: Results were: no abnormalities  Last mammogram: normal Obstetric History: G33P4  Family History:  Family History  Problem Relation Age of Onset  . Cancer Mother        lung  . Kidney disease Mother   . Cancer Father        bone    Social History:    Social History   Social History  . Marital status: Single    Spouse name: N/A  . Number of children: N/A  . Years of education: N/A   Occupational History  . Not on file.   Social History Main Topics  . Smoking status: Never Smoker  . Smokeless tobacco: Never Used  . Alcohol use No  . Drug use: No  . Sexual activity: No   Other Topics Concern  . Not on file   Social History Narrative  . No narrative on file    Allergies:  No Known Allergies  Medications: Prior to Admission medications   Medication Sig Start Date End Date Taking? Authorizing Provider  acetaminophen (TYLENOL) 325 MG tablet Take 650 mg by mouth every 6 (six) hours as needed.   Yes [provider]  amLODipine (NORVASC) 10 MG tablet Take 10 mg by mouth daily.   Yes [provider]  furosemide (LASIX) 40 MG tablet Take 40 mg by mouth daily. 04/12/16  Yes [provider]  metoprolol tartrate (LOPRESSOR) 25 MG tablet Take 12.5 mg by mouth 2 (two) times daily.   Yes [provider]  Multiple Vitamin (MULTIVITAMIN) tablet Take 1 tablet by mouth daily.   Yes [provider]  ramipril (ALTACE) 10 MG capsule Take 10 mg by mouth 2 (two) times daily.    Yes [provider]  Simethicone (GAS RELIEF PO) Take by  mouth as needed.   Yes [provider]  valACYclovir (VALTREX) 1000 MG tablet Take 1 tablet (1,000 mg total) by mouth 3 (three) times daily. 04/30/16  Yes Kathrine Haddock, NP  amLODipine (NORVASC) 5 MG tablet  09/29/16   [provider]  hydroxychloroquine (PLAQUENIL) 200 MG tablet  08/23/16   [provider]  predniSONE (DELTASONE) 10 MG tablet Take by mouth. Take 5 mg every other day 11/03/15   [provider]    Physical Exam Vitals: Blood pressure 134/84, height 5\' 6"  (1.676 m), weight 211 lb (95.7 kg).  General: NAD HEENT: normocephalic, anicteric Thyroid: no enlargement, no palpable nodules Pulmonary: No increased work  of breathing, CTAB Cardiovascular: RRR, distal pulses 2+ Breast: Breast symmetrical, no tenderness, no palpable nodules or masses, no skin or nipple retraction present, no nipple discharge.  No axillary or supraclavicular lymphadenopathy. Abdomen: NABS, soft, non-tender, non-distended.  Umbilicus without lesions.  No hepatomegaly, splenomegaly or masses palpable. No evidence of hernia  Genitourinary:  External: Normal external female genitalia.  Normal urethral meatus, normal  Bartholin's and Skene's glands.    Vagina: Normal vaginal mucosa, no evidence of prolapse.    Cervix: Grossly normal in appearance, no bleeding, no CMT  Uterus: Non-enlarged, mobile, normal contour.    Adnexa: ovaries non-enlarged, no adnexal masses  Rectal: deferred  Lymphatic: no evidence of inguinal lymphadenopathy Extremities: no edema, erythema, or tenderness Neurologic: Grossly intact Psychiatric: mood appropriate, affect full    Assessment: 61 y.o. G4P4 Well woman exam with PAP  Plan: Problem List Items Addressed This Visit    None    Visit Diagnoses    Well woman exam with routine gynecological exam    -  Primary   Relevant Orders   Pap IG (Image Guided)   Screening for diabetes mellitus       Relevant Orders   Hgb A1c w/o eAG   Cervical cancer screening       Relevant Orders   Pap IG (Image Guided)      1) Mammogram - recommend yearly screening mammogram.  Mammogram patient to self schedule at [facility]  2) Increase healthy lifestyle diet and continue exercise  3) ASCCP guidelines and rational discussed.  Patient opts for yearly screening interval  4) Osteoporosis  - per USPTF routine screening DEXA at age 26   5) Routine healthcare maintenance including cholesterol, diabetes screening discussed- primarily managed by other providers. Patient would like to have diabetes screening today.  6) Colonoscopy- pt is up to date- last exam was 1 year ago  7) Follow up 1 year for  routine annual   Rod Can, [place]

## 2016-11-29 DIAGNOSIS — R8281 Pyuria: Secondary | ICD-10-CM | POA: Insufficient documentation

## 2016-11-29 DIAGNOSIS — R0989 Other specified symptoms and signs involving the circulatory and respiratory systems: Secondary | ICD-10-CM | POA: Insufficient documentation

## 2016-11-29 LAB — HGB A1C W/O EAG: HEMOGLOBIN A1C: 5.1 % (ref 4.8–5.6)

## 2016-11-30 DIAGNOSIS — Z9109 Other allergy status, other than to drugs and biological substances: Secondary | ICD-10-CM | POA: Insufficient documentation

## 2016-12-02 LAB — PAP IG (IMAGE GUIDED): PAP SMEAR COMMENT: 0

## 2016-12-11 ENCOUNTER — Other Ambulatory Visit: Payer: Self-pay | Admitting: Advanced Practice Midwife

## 2016-12-11 DIAGNOSIS — Z1231 Encounter for screening mammogram for malignant neoplasm of breast: Secondary | ICD-10-CM

## 2016-12-17 ENCOUNTER — Ambulatory Visit
Admission: RE | Admit: 2016-12-17 | Discharge: 2016-12-17 | Disposition: A | Discharge status: Home / Self Care | Payer: BC Managed Care – PPO | Source: Ambulatory Visit | Attending: Advanced Practice Midwife | Admitting: Advanced Practice Midwife

## 2016-12-17 DIAGNOSIS — Z1231 Encounter for screening mammogram for malignant neoplasm of breast: Secondary | ICD-10-CM | POA: Diagnosis not present

## 2017-11-29 ENCOUNTER — Encounter: Payer: Self-pay | Admitting: Obstetrics and Gynecology

## 2017-11-29 ENCOUNTER — Ambulatory Visit (INDEPENDENT_AMBULATORY_CARE_PROVIDER_SITE_OTHER): Payer: BC Managed Care – PPO | Admitting: Obstetrics and Gynecology

## 2017-11-29 VITALS — BP 132/98 | HR 77 | Ht 66.0 in | Wt 209.0 lb

## 2017-11-29 DIAGNOSIS — Z1231 Encounter for screening mammogram for malignant neoplasm of breast: Secondary | ICD-10-CM | POA: Diagnosis not present

## 2017-11-29 DIAGNOSIS — Z01419 Encounter for gynecological examination (general) (routine) without abnormal findings: Secondary | ICD-10-CM | POA: Diagnosis not present

## 2017-11-29 DIAGNOSIS — Z124 Encounter for screening for malignant neoplasm of cervix: Secondary | ICD-10-CM | POA: Diagnosis not present

## 2017-11-29 DIAGNOSIS — Z113 Encounter for screening for infections with a predominantly sexual mode of transmission: Secondary | ICD-10-CM | POA: Diagnosis not present

## 2017-11-29 DIAGNOSIS — Z1239 Encounter for other screening for malignant neoplasm of breast: Secondary | ICD-10-CM

## 2017-11-29 NOTE — Progress Notes (Signed)
Patient ID: Rebekah Mullins, female   DOB: [birth year], 62 y.o.   MRN: [id number]       Gynecology Annual Exam  PCP: Kathrine Haddock, NP  Chief Complaint:  Chief Complaint  Patient presents with  . Gynecologic Exam    History of Present Illness:Patient is a 62 y.o. G4P4 presents for annual exam. The patient has no complaints today.   LMP: No LMP recorded. Patient is postmenopausal. No PMB  The patient is not sexually active. The patient does perform self breast exams.  There is no notable family history of breast or ovarian cancer in her family.  The patient wears seatbelts: yes.   The patient has regular exercise: not asked.    The patient denies current symptoms of depression.     Review of Systems: Review of Systems  Constitutional: Negative.   HENT: Negative.   Eyes: Negative.   Respiratory: Positive for shortness of breath.   Cardiovascular: Positive for chest pain. Negative for palpitations, orthopnea, claudication, leg swelling and PND.  Gastrointestinal: Negative.   Genitourinary: Negative.   Musculoskeletal: Negative.   Skin: Negative.   Neurological: Negative.   Endo/Heme/Allergies: Positive for environmental allergies. Negative for polydipsia. Does not bruise/bleed easily.  Psychiatric/Behavioral: Negative.    *Chest pain shortness of breath has follow up with pulmonary scheduled  Past Medical History:  Past Medical History:  Diagnosis Date  . Hypertension   . Renal insufficiency   . Vasculitis ([place])   . Wagner syndrome     Past Surgical History:  Past Surgical History:  Procedure Laterality Date  . BREAST EXCISIONAL BIOPSY Right 1975   NEG  . COLONOSCOPY    . COLONOSCOPY WITH PROPOFOL N/A 12/07/2015   Procedure: COLONOSCOPY WITH PROPOFOL;  Surgeon: Christene Lye, MD;  Location: [facility] ENDOSCOPY;  Service: Endoscopy;  Laterality: N/A;  . RENAL BIOPSY  2002, 2005   2 biopsies    Gynecologic History:  No LMP recorded. Patient is  postmenopausal. Last Pap: Results were:  11/29/2016 NIL 11/03/2015 NIL HPV positive 16/18 negative/negative 04/29/2015 NIL HPV negative 08/31/2014 LSIL HPV negative 08/26/2013 NIL HPV negative 08/21/2012 NIL HPV negative 07/12/2011 NIL HPV negative 07/05/2010 NIL HPV negative Last mammogram: 12/17/2016 Results were: BI-RAD I  Obstetric History: G4P4  Family History:  Family History  Problem Relation Age of Onset  . Cancer Mother        lung  . Kidney disease Mother   . Cancer Father        bone  . Breast cancer Neg Hx     Social History:  Social History   Socioeconomic History  . Marital status: Single    Spouse name: Not on file  . Number of children: Not on file  . Years of education: Not on file  . Highest education level: Not on file  Occupational History  . Not on file  Social Needs  . Financial resource strain: Not on file  . Food insecurity:    Worry: Not on file    Inability: Not on file  . Transportation needs:    Medical: Not on file    Non-medical: Not on file  Tobacco Use  . Smoking status: Never Smoker  . Smokeless tobacco: Never Used  Substance and Sexual Activity  . Alcohol use: No  . Drug use: No  . Sexual activity: Not Currently  Lifestyle  . Physical activity:    Days per week: Not on file    Minutes per session: Not on file  .  Stress: Not on file  Relationships  . Social connections:    Talks on phone: Not on file    Gets together: Not on file    Attends religious service: Not on file    Active member of club or organization: Not on file    Attends meetings of clubs or organizations: Not on file    Relationship status: Not on file  . Intimate partner violence:    Fear of current or ex partner: Not on file    Emotionally abused: Not on file    Physically abused: Not on file    Forced sexual activity: Not on file  Other Topics Concern  . Not on file  Social History Narrative  . Not on file    Allergies:  No Known  Allergies  Medications: Prior to Admission medications   Medication Sig Start Date End Date Taking? Authorizing Provider  amLODipine (NORVASC) 10 MG tablet Take 10 mg by mouth daily.   Yes [provider]  aspirin EC 81 MG tablet Take by mouth.   Yes [provider]  atorvastatin (LIPITOR) 20 MG tablet  10/21/17  Yes [provider]  fluticasone (FLONASE) 50 MCG/ACT nasal spray 1 spray by Each Nare route daily. 11/30/16 11/30/17 Yes [provider]  furosemide (LASIX) 40 MG tablet Take 40 mg by mouth daily. 04/12/16  Yes [provider]  metoprolol tartrate (LOPRESSOR) 25 MG tablet Take 12.5 mg by mouth 2 (two) times daily.   Yes [provider]  Multiple Vitamin (MULTIVITAMIN) tablet Take 1 tablet by mouth daily.   Yes [provider]  ramipril (ALTACE) 10 MG capsule Take 10 mg by mouth 2 (two) times daily.    Yes [provider]  Simethicone (GAS RELIEF PO) Take by mouth as needed.   Yes [provider]  valACYclovir (VALTREX) 1000 MG tablet Take 1 tablet (1,000 mg total) by mouth 3 (three) times daily. 04/30/16  Yes Kathrine Haddock, NP    Physical Exam Vitals: Blood pressure (!) 132/98, pulse 77, height 5\' 6"  (1.676 m), weight 209 lb (94.8 kg).  General: NAD HEENT: normocephalic, anicteric Thyroid: no enlargement, no palpable nodules Pulmonary: No increased work of breathing, CTAB Cardiovascular: RRR, distal pulses 2+ Breast: Breast symmetrical, no tenderness, no palpable nodules or masses, no skin or nipple retraction present, no nipple discharge.  No axillary or supraclavicular lymphadenopathy. Abdomen: NABS, soft, non-tender, non-distended.  Umbilicus without lesions.  No hepatomegaly, splenomegaly or masses palpable. No evidence of hernia  Genitourinary:  External: Normal external female genitalia.  Normal urethral meatus, normal Bartholin's and Skene's glands.    Vagina: Normal vaginal mucosa, no evidence  of prolapse.    Cervix: Grossly normal in appearance, no bleeding  Uterus: Non-enlarged, mobile, normal contour.  No CMT  Adnexa: ovaries non-enlarged, no adnexal masses  Rectal: deferred  Lymphatic: no evidence of inguinal lymphadenopathy Extremities: no edema, erythema, or tenderness Neurologic: Grossly intact Psychiatric: mood appropriate, affect full  Female chaperone present for pelvic and breast  portions of the physical exam     Did not have co-testing last year so for HPV positive pap 2017 will repeat today  Assessment: 63 y.o. G4P4 routine annual exam  Plan: Problem List Items Addressed This Visit    None    Visit Diagnoses    Routine screening for STI (sexually transmitted infection)    -  Primary   Relevant Orders   PapIG, CtNgTv, HPV, rfx 16/18   Screening for malignant neoplasm  of cervix       Relevant Orders   PapIG, CtNgTv, HPV, rfx 16/18   Breast screening       Relevant Orders   MM DIGITAL SCREENING BILATERAL   Encounter for gynecological examination without abnormal finding          1) Mammogram - recommend yearly screening mammogram.  Mammogram Was ordered today  2) STI screening  was notoffered and therefore not obtained  3) ASCCP guidelines and rational discussed.  Patient opts for yearly screening interval  4) Osteoporosis  - per USPTF routine screening DEXA at age 33 - FRAX 60 year major fracture risk 2.8%,  10 year hip fracture risk 0.2%  Consider FDA-approved medical therapies in postmenopausal women and men aged 34 years and older, based on the following: a) A hip or vertebral (clinical or morphometric) fracture b) T-score ? -2.5 at the femoral neck or spine after appropriate evaluation to exclude secondary causes C) Low bone mass (T-score between -1.0 and -2.5 at the femoral neck or spine) and a 10-year probability of a hip fracture ? 3% or a 10-year probability of a major osteoporosis-related fracture ? 20% based on the [place]-adapted WHO  algorithm   5) Routine healthcare maintenance including cholesterol, diabetes screening discussed managed by PCP  6) Colonoscopy - up to date 12/07/2015 Dr. Jamal Collin  7) Return in 1 year (on 11/30/2018) for annual.    Malachy Mood, MD Mosetta Pigeon, [place] 11/29/2017, 10:25 AM

## 2017-11-29 NOTE — Patient Instructions (Signed)
Preventive Care 40-64 Years, Female Preventive care refers to lifestyle choices and visits with your health care provider that can promote health and wellness. What does preventive care include?  A yearly physical exam. This is also called an annual well check.  Dental exams once or twice a year.  Routine eye exams. Ask your health care provider how often you should have your eyes checked.  Personal lifestyle choices, including: ? Daily care of your teeth and gums. ? Regular physical activity. ? Eating a healthy diet. ? Avoiding tobacco and drug use. ? Limiting alcohol use. ? Practicing safe sex. ? Taking low-dose aspirin daily starting at age 63. ? Taking vitamin and mineral supplements as recommended by your health care provider. What happens during an annual well check? The services and screenings done by your health care provider during your annual well check will depend on your age, overall health, lifestyle risk factors, and family history of disease. Counseling Your health care provider may ask you questions about your:  Alcohol use.  Tobacco use.  Drug use.  Emotional well-being.  Home and relationship well-being.  Sexual activity.  Eating habits.  Work and work Statistician.  Method of birth control.  Menstrual cycle.  Pregnancy history.  Screening You may have the following tests or measurements:  Height, weight, and BMI.  Blood pressure.  Lipid and cholesterol levels. These may be checked every 5 years, or more frequently if you are over 63 years old.  Skin check.  Lung cancer screening. You may have this screening every year starting at age 63 if you have a 30-pack-year history of smoking and currently smoke or have quit within the past 15 years.  Fecal occult blood test (FOBT) of the stool. You may have this test every year starting at age 63.  Flexible sigmoidoscopy or colonoscopy. You may have a sigmoidoscopy every 5 years or a colonoscopy  every 10 years starting at age 30.  Hepatitis C blood test.  Hepatitis B blood test.  Sexually transmitted disease (STD) testing.  Diabetes screening. This is done by checking your blood sugar (glucose) after you have not eaten for a while (fasting). You may have this done every 1-3 years.  Mammogram. This may be done every 1-2 years. Talk to your health care provider about when you should start having regular mammograms. This may depend on whether you have a family history of breast cancer.  BRCA-related cancer screening. This may be done if you have a family history of breast, ovarian, tubal, or peritoneal cancers.  Pelvic exam and Pap test. This may be done every 3 years starting at age 63. Starting at age 63, this may be done every 5 years if you have a Pap test in combination with an HPV test.  Bone density scan. This is done to screen for osteoporosis. You may have this scan if you are at high risk for osteoporosis.  Discuss your test results, treatment options, and if necessary, the need for more tests with your health care provider. Vaccines Your health care provider may recommend certain vaccines, such as:  Influenza vaccine. This is recommended every year.  Tetanus, diphtheria, and acellular pertussis (Tdap, Td) vaccine. You may need a Td booster every 10 years.  Varicella vaccine. You may need this if you have not been vaccinated.  Zoster vaccine. You may need this after age 5.  Measles, mumps, and rubella (MMR) vaccine. You may need at least one dose of MMR if you were born in  1957 or later. You may also need a second dose.  Pneumococcal 13-valent conjugate (PCV13) vaccine. You may need this if you have certain conditions and were not previously vaccinated.  Pneumococcal polysaccharide (PPSV23) vaccine. You may need one or two doses if you smoke cigarettes or if you have certain conditions.  Meningococcal vaccine. You may need this if you have certain  conditions.  Hepatitis A vaccine. You may need this if you have certain conditions or if you travel or work in places where you may be exposed to hepatitis A.  Hepatitis B vaccine. You may need this if you have certain conditions or if you travel or work in places where you may be exposed to hepatitis B.  Haemophilus influenzae type b (Hib) vaccine. You may need this if you have certain conditions.  Talk to your health care provider about which screenings and vaccines you need and how often you need them. This information is not intended to replace advice given to you by your health care provider. Make sure you discuss any questions you have with your health care provider. Document Released: 07/29/2015 Document Revised: 03/21/2016 Document Reviewed: 05/03/2015 Elsevier Interactive Patient Education  2018 Elsevier Inc.  

## 2017-12-04 LAB — PAPIG, CTNGTV, HPV, RFX 16/18
CHLAMYDIA, NUC. ACID AMP: NEGATIVE
Gonococcus, Nuc. Acid Amp: NEGATIVE
HPV, HIGH-RISK: NEGATIVE
PAP SMEAR COMMENT: 0
Trich vag by NAA: NEGATIVE

## 2017-12-11 ENCOUNTER — Telehealth: Payer: Self-pay

## 2017-12-11 NOTE — Telephone Encounter (Signed)
Did you call patient regarding pap or visit from 5/17?

## 2017-12-11 NOTE — Telephone Encounter (Signed)
Pt called after hour nurse stating she was returning a call from our office 5/24.  [phone]

## 2017-12-11 NOTE — Telephone Encounter (Signed)
Yes I just wanted to inform her that her pap this year was normal since she had a history of prior abnormals

## 2017-12-12 NOTE — Telephone Encounter (Signed)
Pt aware.

## 2017-12-20 ENCOUNTER — Ambulatory Visit
Admission: RE | Admit: 2017-12-20 | Discharge: 2017-12-20 | Disposition: A | Discharge status: Home / Self Care | Payer: BC Managed Care – PPO | Source: Ambulatory Visit | Attending: Obstetrics and Gynecology | Admitting: Obstetrics and Gynecology

## 2017-12-20 DIAGNOSIS — Z1231 Encounter for screening mammogram for malignant neoplasm of breast: Secondary | ICD-10-CM | POA: Insufficient documentation

## 2017-12-20 DIAGNOSIS — Z1239 Encounter for other screening for malignant neoplasm of breast: Secondary | ICD-10-CM

## 2017-12-24 IMAGING — MG MM DIGITAL SCREENING BILAT W/ CAD
4 series · 4 of 4 positions shown · non-contrast
Comparison: Previous exam(s).

CLINICAL DATA: Screening.

EXAM:
DIGITAL SCREENING BILATERAL MAMMOGRAM WITH CAD

[L MLO]
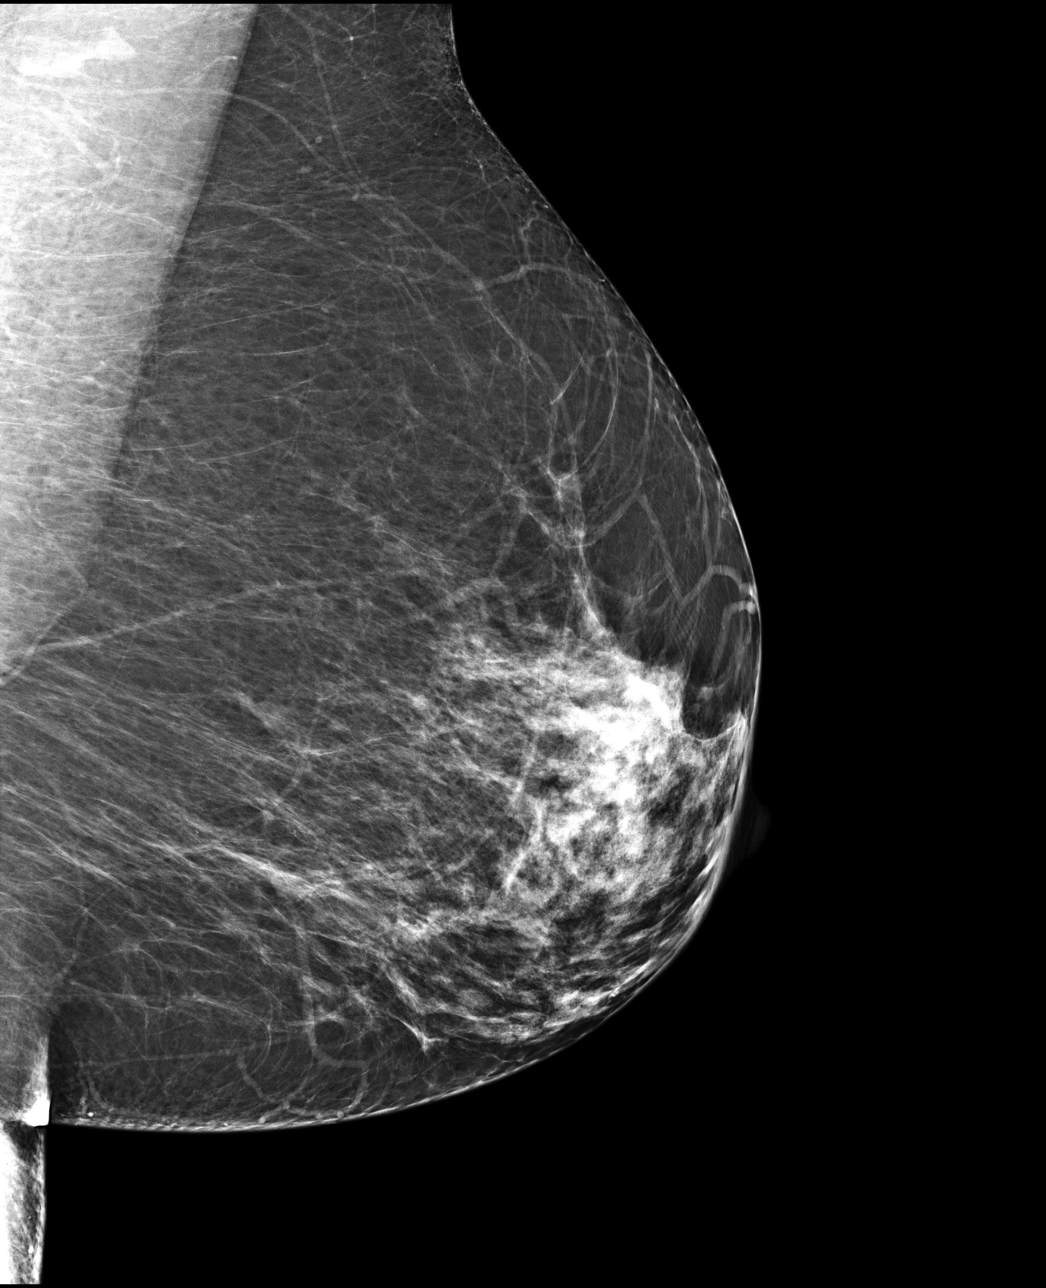

[R MLO]
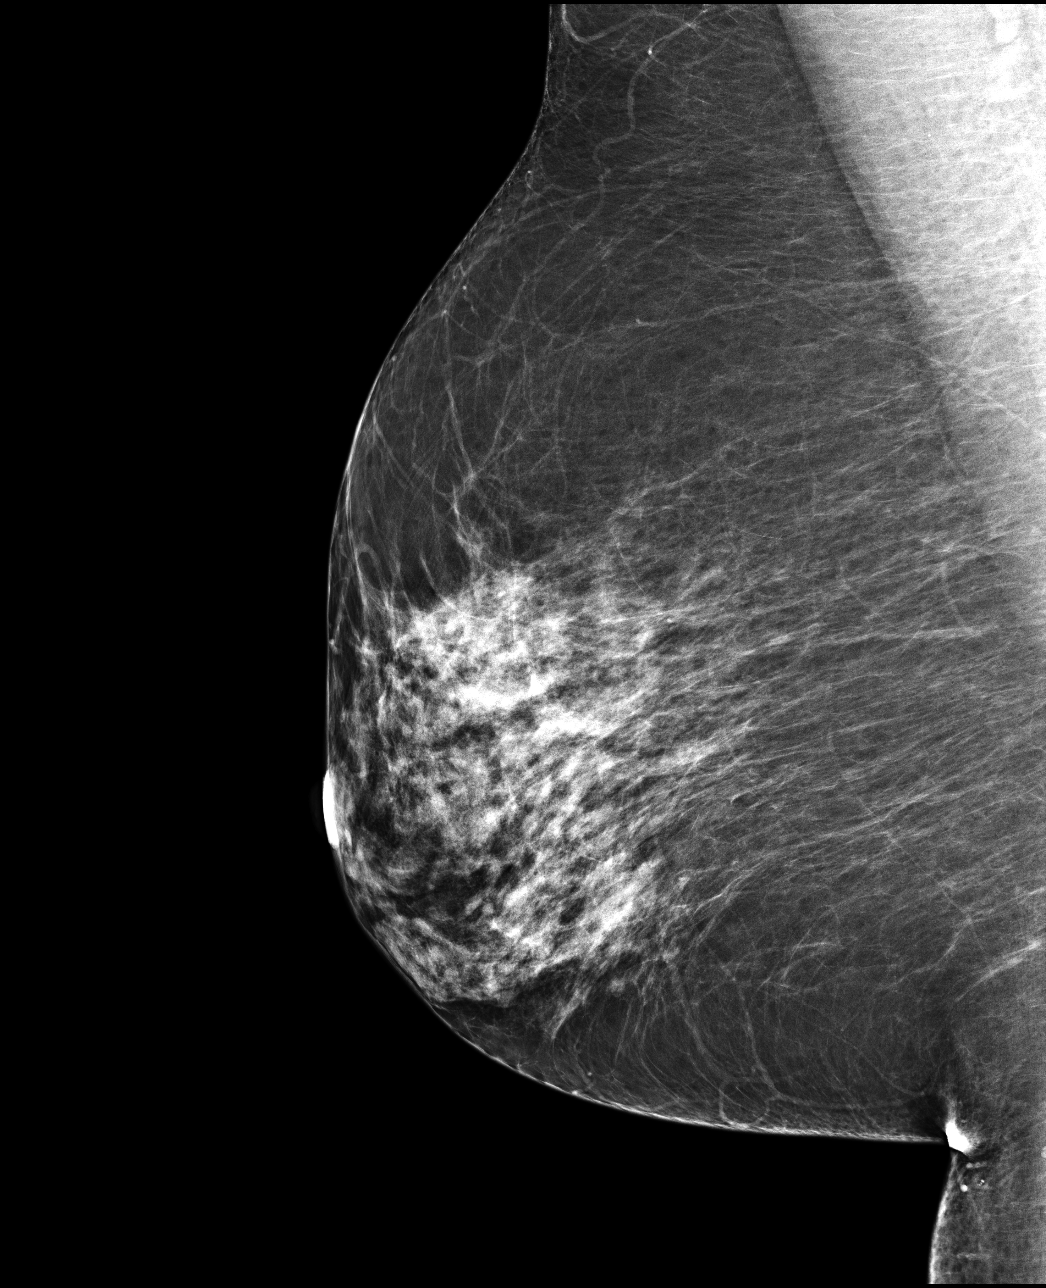

[R CC]
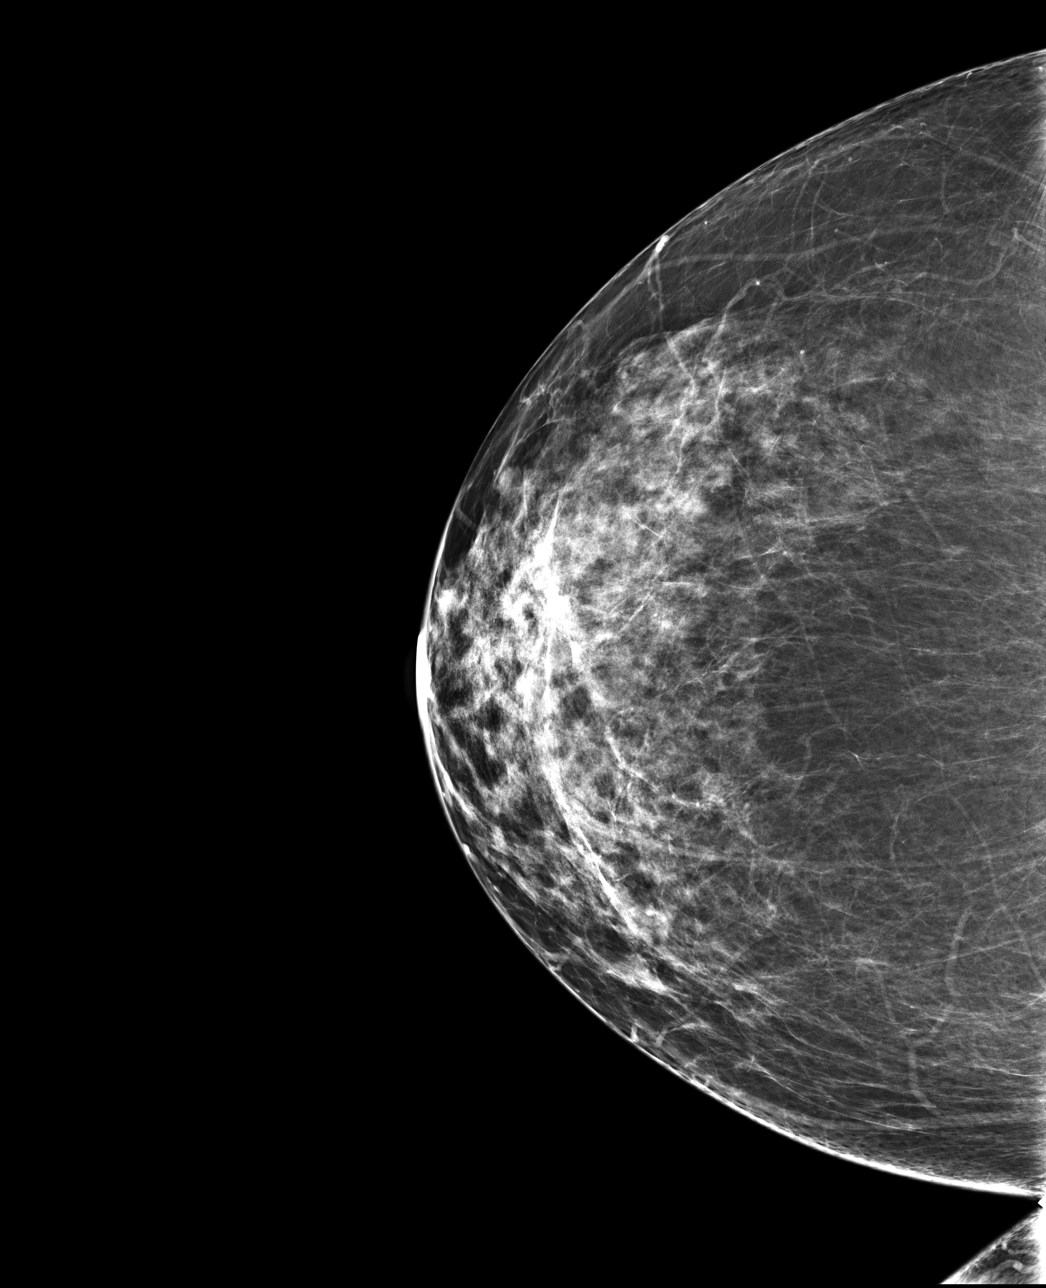

[L CC]
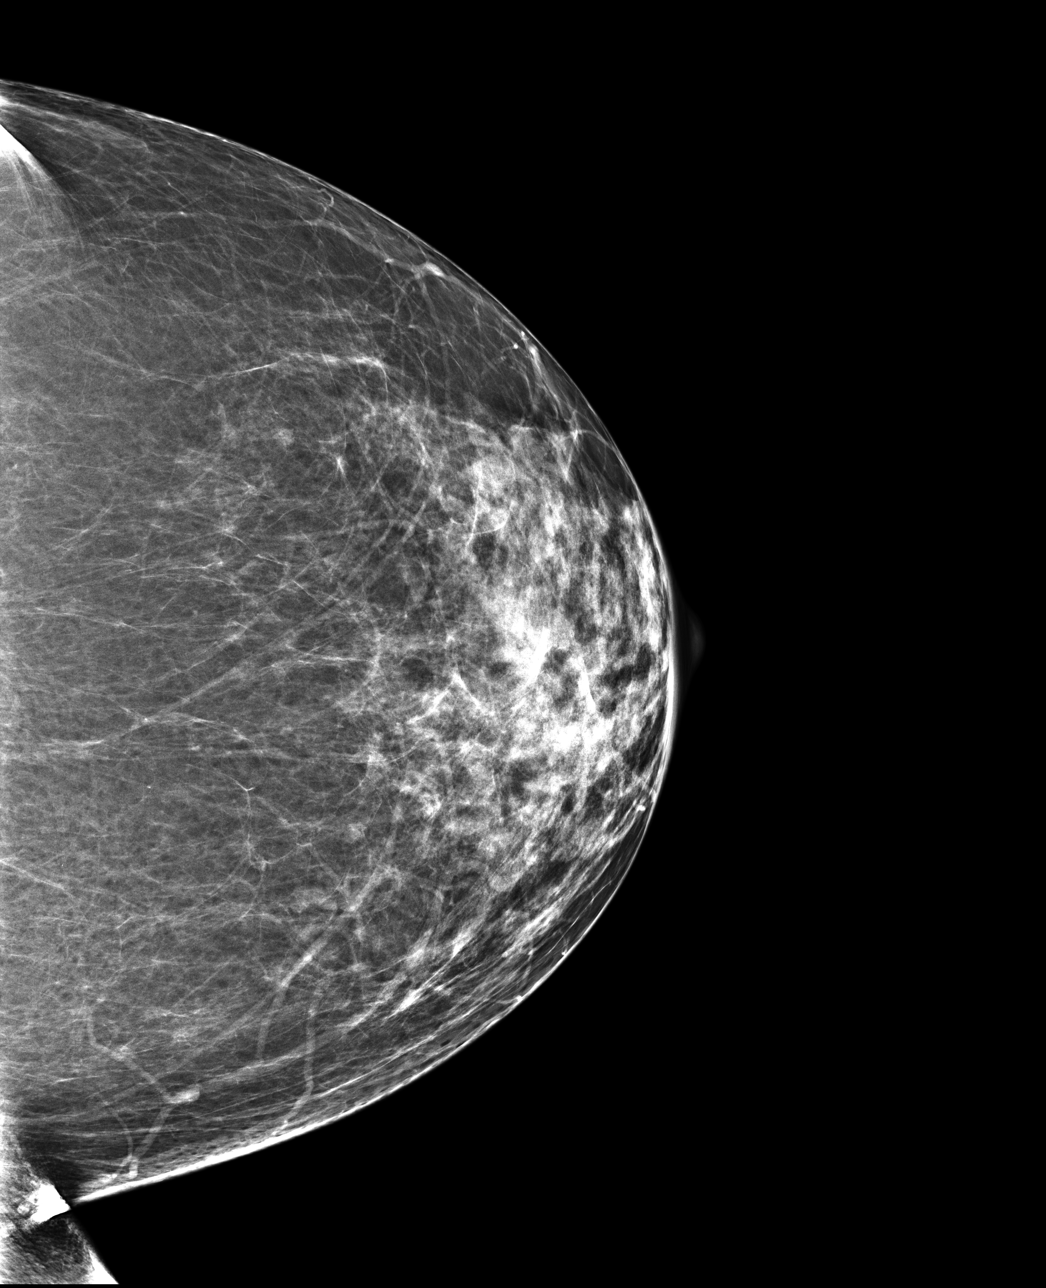

[4 of 4 positions shown; findings below may reference images not displayed]

ACR Breast Density Category b: There are scattered areas of
fibroglandular density.
FINDINGS: There are no findings suspicious for malignancy. Images were
processed with CAD.
IMPRESSION: No mammographic evidence of malignancy. A result letter of this
screening mammogram will be mailed directly to the patient.

RECOMMENDATION:
Screening mammogram in one year. (Code:AS-G-LCT)

BI-RADS CATEGORY  1: Negative.

## 2018-02-17 ENCOUNTER — Telehealth: Payer: Self-pay

## 2018-02-17 NOTE — Telephone Encounter (Signed)
Pt calling triage line and states she wants the complete std testing with HIV,  when she came in last time she didnt do blood work , she needs to know if she needs to make a appointment or just go to lab, please advise

## 2018-02-19 ENCOUNTER — Other Ambulatory Visit: Payer: Self-pay | Admitting: Obstetrics and Gynecology

## 2018-02-19 DIAGNOSIS — Z113 Encounter for screening for infections with a predominantly sexual mode of transmission: Secondary | ICD-10-CM

## 2018-02-19 NOTE — Telephone Encounter (Signed)
You can let patient know additional lab orders are in for blood work STD testing

## 2018-02-19 NOTE — Telephone Encounter (Signed)
Patient is calling to speak with Dr. Georgianne Fick nurse . Please advise

## 2018-02-19 NOTE — Telephone Encounter (Signed)
Please advise and put an order in for labs.

## 2018-02-19 NOTE — Telephone Encounter (Signed)
Patient is schedule 02/20/18 for labs

## 2018-02-20 ENCOUNTER — Other Ambulatory Visit: Payer: BC Managed Care – PPO

## 2018-02-20 DIAGNOSIS — Z113 Encounter for screening for infections with a predominantly sexual mode of transmission: Secondary | ICD-10-CM

## 2018-02-21 LAB — HEP, RPR, HIV PANEL
HIV Screen 4th Generation wRfx: NONREACTIVE
Hepatitis B Surface Ag: NEGATIVE
RPR: NONREACTIVE

## 2018-02-24 ENCOUNTER — Telehealth: Payer: Self-pay

## 2018-02-24 NOTE — Telephone Encounter (Signed)
Pt calling for lab results from last Thrusday.  [phone]  Pt aware of negative results.

## 2018-11-17 ENCOUNTER — Other Ambulatory Visit: Payer: Self-pay | Admitting: Obstetrics and Gynecology

## 2018-11-17 DIAGNOSIS — Z1231 Encounter for screening mammogram for malignant neoplasm of breast: Secondary | ICD-10-CM

## 2018-12-01 ENCOUNTER — Ambulatory Visit: Payer: BC Managed Care – PPO | Admitting: Obstetrics and Gynecology

## 2018-12-30 ENCOUNTER — Encounter: Payer: Self-pay | Admitting: Obstetrics and Gynecology

## 2018-12-30 ENCOUNTER — Other Ambulatory Visit: Payer: Self-pay

## 2018-12-30 ENCOUNTER — Ambulatory Visit (INDEPENDENT_AMBULATORY_CARE_PROVIDER_SITE_OTHER): Payer: BC Managed Care – PPO | Admitting: Obstetrics and Gynecology

## 2018-12-30 ENCOUNTER — Other Ambulatory Visit (HOSPITAL_COMMUNITY)
Admission: RE | Admit: 2018-12-30 | Discharge: 2018-12-30 | Disposition: A | Discharge status: Home / Self Care | Payer: BC Managed Care – PPO | Source: Ambulatory Visit | Attending: Obstetrics and Gynecology | Admitting: Obstetrics and Gynecology

## 2018-12-30 VITALS — BP 140/84 | HR 74 | Ht 67.0 in | Wt 212.0 lb

## 2018-12-30 DIAGNOSIS — Z01419 Encounter for gynecological examination (general) (routine) without abnormal findings: Secondary | ICD-10-CM

## 2018-12-30 DIAGNOSIS — Z124 Encounter for screening for malignant neoplasm of cervix: Secondary | ICD-10-CM

## 2018-12-30 DIAGNOSIS — Z113 Encounter for screening for infections with a predominantly sexual mode of transmission: Secondary | ICD-10-CM | POA: Diagnosis present

## 2018-12-30 DIAGNOSIS — E669 Obesity, unspecified: Secondary | ICD-10-CM

## 2018-12-30 DIAGNOSIS — Z1239 Encounter for other screening for malignant neoplasm of breast: Secondary | ICD-10-CM

## 2018-12-30 NOTE — Progress Notes (Signed)
Gynecology Annual Exam  PCP: Kathrine Haddock, NP  Chief Complaint:  Chief Complaint  Patient presents with   Gynecologic Exam    discuss weight loss   STD check per request    History of Present Illness:Patient is a 63 y.o. G4P4 presents for annual exam. The patient has no complaints today.   LMP: No LMP recorded. Patient is postmenopausal.  The patient is sexually active. She denies dyspareunia.  The patient does perform self breast exams.  There is no notable family history of breast or ovarian cancer in her family.  The patient wears seatbelts: yes.   The patient has regular exercise: not asked.     The patient denies current symptoms of depression.     Review of Systems: Review of Systems  Constitutional: Negative for chills and fever.  HENT: Negative for congestion.   Respiratory: Negative for cough and shortness of breath.   Cardiovascular: Negative for chest pain and palpitations.  Gastrointestinal: Negative for abdominal pain, constipation, diarrhea, heartburn, nausea and vomiting.  Genitourinary: Negative for dysuria, frequency and urgency.  Skin: Negative for itching and rash.  Neurological: Negative for dizziness and headaches.  Endo/Heme/Allergies: Negative for polydipsia.  Psychiatric/Behavioral: Negative for depression.    Past Medical History:  Past Medical History:  Diagnosis Date   Hypertension    Renal insufficiency    Vasculitis ([place])    Wagner syndrome     Past Surgical History:  Past Surgical History:  Procedure Laterality Date   BREAST EXCISIONAL BIOPSY Right 1975   NEG   COLONOSCOPY     COLONOSCOPY WITH PROPOFOL N/A 12/07/2015   Procedure: COLONOSCOPY WITH PROPOFOL;  Surgeon: Christene Lye, MD;  Location: [facility] ENDOSCOPY;  Service: Endoscopy;  Laterality: N/A;   RENAL BIOPSY  2002, 2005   2 biopsies    Gynecologic History:  No LMP recorded. Patient is postmenopausal. Last Pap: Results were: 11/29/2017 NIL and HR HPV  negative  11/29/2016 NIL 11/03/2015 NIL HPV positive 16/18 negative/negative 04/29/2015 NIL HPV negative 08/31/2014 LSIL HPV negative 08/26/2013 NIL HPV negative 08/21/2012 NIL HPV negative 07/12/2011 NIL HPV negative 07/05/2010 NIL HPV negative Last mammogram: 12/20/2017 Results were: BI-RAD I  Obstetric History: G4P4  Family History:  Family History  Problem Relation Age of Onset   Cancer Mother        lung   Kidney disease Mother    Cancer Father        bone   Breast cancer Neg Hx     Social History:  Social History   Socioeconomic History   Marital status: Single    Spouse name: Not on file   Number of children: Not on file   Years of education: Not on file   Highest education level: Not on file  Occupational History   Not on file  Social Needs   Financial resource strain: Not on file   Food insecurity    Worry: Not on file    Inability: Not on file   Transportation needs    Medical: Not on file    Non-medical: Not on file  Tobacco Use   Smoking status: Never Smoker   Smokeless tobacco: Never Used  Substance and Sexual Activity   Alcohol use: No   Drug use: No   Sexual activity: Not Currently  Lifestyle   Physical activity    Days per week: Not on file    Minutes per session: Not on file   Stress: Not on file  Relationships  Social Herbalist on phone: Not on file    Gets together: Not on file    Attends religious service: Not on file    Active member of club or organization: Not on file    Attends meetings of clubs or organizations: Not on file    Relationship status: Not on file   Intimate partner violence    Fear of current or ex partner: Not on file    Emotionally abused: Not on file    Physically abused: Not on file    Forced sexual activity: Not on file  Other Topics Concern   Not on file  Social History Narrative   Not on file    Allergies:  No Known Allergies  Medications: Prior to Admission  medications   Medication Sig Start Date End Date Taking? Authorizing Provider  amLODipine (NORVASC) 10 MG tablet Take 10 mg by mouth daily.    [provider]  aspirin EC 81 MG tablet Take by mouth.    [provider]  atorvastatin (LIPITOR) 20 MG tablet  10/21/17   [provider]  fluticasone (FLONASE) 50 MCG/ACT nasal spray 1 spray by Each Nare route daily. 11/30/16 11/30/17  [provider]  furosemide (LASIX) 40 MG tablet Take 40 mg by mouth daily. 04/12/16   [provider]  metoprolol tartrate (LOPRESSOR) 25 MG tablet Take 12.5 mg by mouth 2 (two) times daily.    [provider]  Multiple Vitamin (MULTIVITAMIN) tablet Take 1 tablet by mouth daily.    [provider]  ramipril (ALTACE) 10 MG capsule Take 10 mg by mouth 2 (two) times daily.     [provider]  Simethicone (GAS RELIEF PO) Take by mouth as needed.    [provider]  valACYclovir (VALTREX) 1000 MG tablet Take 1 tablet (1,000 mg total) by mouth 3 (three) times daily. 04/30/16   Kathrine Haddock, NP    Physical Exam Vitals: Blood pressure 140/84, pulse 74, height 5\' 7"  (1.702 m), weight 212 lb (96.2 kg). Body mass index is 33.2 kg/m.   General: NAD HEENT: normocephalic, anicteric Thyroid: no enlargement, no palpable nodules Pulmonary: No increased work of breathing, CTAB Cardiovascular: RRR, distal pulses 2+ Breast: Breast symmetrical, no tenderness, no palpable nodules or masses, no skin or nipple retraction present, no nipple discharge.  No axillary or supraclavicular lymphadenopathy. Abdomen: NABS, soft, non-tender, non-distended.  Umbilicus without lesions.  No hepatomegaly, splenomegaly or masses palpable. No evidence of hernia  Genitourinary:  External: Normal external female genitalia.  Normal urethral meatus, normal Bartholin's and Skene's glands.    Vagina: Normal vaginal mucosa, no evidence of prolapse.    Cervix: Grossly normal in  appearance, no bleeding  Uterus: Non-enlarged, mobile, normal contour.  No CMT  Adnexa: ovaries non-enlarged, no adnexal masses  Rectal: deferred  Lymphatic: no evidence of inguinal lymphadenopathy Extremities: no edema, erythema, or tenderness Neurologic: Grossly intact Psychiatric: mood appropriate, affect full  Female chaperone present for pelvic and breast  portions of the physical exam     Assessment: 63 y.o. G4P4 routine annual exam  Plan: Problem List Items Addressed This Visit    None    Visit Diagnoses    Encounter for gynecological examination without abnormal finding    -  Primary   Screening for malignant neoplasm of cervix       Relevant Orders   Cytology - PAP   Breast screening       Relevant Orders  MM 3D SCREEN BREAST BILATERAL   Routine screening for STI (sexually transmitted infection)       Relevant Orders   Cytology - PAP   HEP, RPR, HIV Panel   Class 1 obesity with serious comorbidity and body mass index (BMI) of 33.0 to 33.9 in adult, unspecified obesity type          1) Mammogram - recommend yearly screening mammogram.  Mammogram Was ordered today  2) STI screening  wasoffered and accepted  3) ASCCP guidelines and rational discussed.  Patient opts for yearly screening interval  4) Osteoporosis  - per USPTF routine screening DEXA at age 62  5) Routine healthcare maintenance including cholesterol, diabetes screening discussed managed by PCP  6) Colonoscopy - UTD 12/07/2015 Dr. Jamal Collin  7) Obesity - given CKD and HTN not a good candidate for medical weight loss therapy.  Consider referral to [facility] [place] [facility] for medical weight loss therapy.  Otherwise orlistat would be an OTC option  8) Social - getting married this November  9) Return in about 1 year (around 12/30/2019) for annual.    Malachy Mood, MD Mosetta Pigeon, [facility] 12/30/2018, 10:12 AM

## 2018-12-30 NOTE — Patient Instructions (Signed)
[facility] [address] [place] [address]  [facility]  [address] [place] [address]  Phone: [phone]  

## 2018-12-31 LAB — HEP, RPR, HIV PANEL
HIV Screen 4th Generation wRfx: NONREACTIVE
Hepatitis B Surface Ag: NEGATIVE
RPR Ser Ql: NONREACTIVE

## 2019-01-02 LAB — CYTOLOGY - PAP
Chlamydia: NEGATIVE
Diagnosis: NEGATIVE
HPV 16/18/45 genotyping: NEGATIVE
HPV: DETECTED — AB
Neisseria Gonorrhea: NEGATIVE
Trichomonas: NEGATIVE

## 2019-01-15 ENCOUNTER — Other Ambulatory Visit: Payer: Self-pay

## 2019-01-15 ENCOUNTER — Ambulatory Visit
Admission: RE | Admit: 2019-01-15 | Discharge: 2019-01-15 | Disposition: A | Discharge status: Home / Self Care | Payer: BC Managed Care – PPO | Source: Ambulatory Visit | Attending: Obstetrics and Gynecology | Admitting: Obstetrics and Gynecology

## 2019-01-15 ENCOUNTER — Encounter: Payer: Self-pay | Admitting: Obstetrics and Gynecology

## 2019-01-15 ENCOUNTER — Other Ambulatory Visit: Payer: Self-pay | Admitting: Obstetrics and Gynecology

## 2019-01-15 DIAGNOSIS — Z1231 Encounter for screening mammogram for malignant neoplasm of breast: Secondary | ICD-10-CM | POA: Insufficient documentation

## 2019-01-19 NOTE — Telephone Encounter (Signed)
Pt called after hour nurse 01/15/19 at 5:20pm stating she just missed a call from our office and wants to know what it's about.  [phone]

## 2019-01-19 NOTE — Telephone Encounter (Signed)
Left detailed msg.

## 2019-01-19 NOTE — Telephone Encounter (Signed)
Would you let her know that I am out of the office on vacation.  You can read my mychart message to her or tell her to check her mychart messages

## 2019-11-10 ENCOUNTER — Telehealth: Payer: Self-pay | Admitting: Obstetrics and Gynecology

## 2019-11-10 NOTE — Telephone Encounter (Signed)
Pt is moving and would like mammo orders put in so she can try to get scheduled the same day as her appt with AMS. Please call pt once orders have been placed so she can call to get scheduled.  Thank you

## 2019-11-10 NOTE — Telephone Encounter (Signed)
Please order mammogram

## 2019-11-11 ENCOUNTER — Other Ambulatory Visit: Payer: Self-pay | Admitting: Obstetrics and Gynecology

## 2019-11-11 DIAGNOSIS — Z1231 Encounter for screening mammogram for malignant neoplasm of breast: Secondary | ICD-10-CM

## 2019-11-11 NOTE — Telephone Encounter (Signed)
Pt aware.

## 2019-11-11 NOTE — Telephone Encounter (Signed)
Order has been placed. [facility] [place] [address]  [facility]  [address] [place] [address]  Phone: [phone]

## 2020-01-01 ENCOUNTER — Other Ambulatory Visit: Payer: Self-pay

## 2020-01-01 ENCOUNTER — Other Ambulatory Visit (HOSPITAL_COMMUNITY)
Admission: RE | Admit: 2020-01-01 | Discharge: 2020-01-01 | Disposition: A | Discharge status: Home / Self Care | Payer: BC Managed Care – PPO | Source: Ambulatory Visit | Attending: Obstetrics and Gynecology | Admitting: Obstetrics and Gynecology

## 2020-01-01 ENCOUNTER — Ambulatory Visit (INDEPENDENT_AMBULATORY_CARE_PROVIDER_SITE_OTHER): Payer: BC Managed Care – PPO | Admitting: Obstetrics and Gynecology

## 2020-01-01 ENCOUNTER — Encounter: Payer: Self-pay | Admitting: Obstetrics and Gynecology

## 2020-01-01 VITALS — BP 142/86 | HR 71 | Ht 66.0 in | Wt 214.0 lb

## 2020-01-01 DIAGNOSIS — Z1239 Encounter for other screening for malignant neoplasm of breast: Secondary | ICD-10-CM | POA: Diagnosis not present

## 2020-01-01 DIAGNOSIS — Z124 Encounter for screening for malignant neoplasm of cervix: Secondary | ICD-10-CM | POA: Insufficient documentation

## 2020-01-01 DIAGNOSIS — N760 Acute vaginitis: Secondary | ICD-10-CM | POA: Insufficient documentation

## 2020-01-01 DIAGNOSIS — L820 Inflamed seborrheic keratosis: Secondary | ICD-10-CM | POA: Insufficient documentation

## 2020-01-01 DIAGNOSIS — Z13820 Encounter for screening for osteoporosis: Secondary | ICD-10-CM

## 2020-01-01 DIAGNOSIS — Z01419 Encounter for gynecological examination (general) (routine) without abnormal findings: Secondary | ICD-10-CM

## 2020-01-01 DIAGNOSIS — L918 Other hypertrophic disorders of the skin: Secondary | ICD-10-CM

## 2020-01-01 NOTE — Progress Notes (Signed)
Gynecology Annual Exam  PCP: Kathrine Haddock, NP  Chief Complaint:  Chief Complaint  Patient presents with  . Gynecologic Exam    History of Present Illness:Patient is a 64 y.o. G4P4 presents for annual exam. The patient has no complaints today.   LMP: No LMP recorded. Patient is postmenopausal. No PMB noted  The patient is sexually active. She denies dyspareunia.  The patient does perform self breast exams.  There is no notable family history of breast or ovarian cancer in her family.  The patient wears seatbelts: yes.   The patient has regular exercise: not asked.    The patient denies current symptoms of depression.     Review of Systems: Review of Systems  Constitutional: Negative for chills and fever.  HENT: Negative for congestion.   Respiratory: Negative for cough and shortness of breath.   Cardiovascular: Negative for chest pain and palpitations.  Gastrointestinal: Negative for abdominal pain, constipation, diarrhea, heartburn, nausea and vomiting.  Genitourinary: Negative for dysuria, frequency and urgency.  Skin: Negative for itching and rash.  Neurological: Negative for dizziness and headaches.  Endo/Heme/Allergies: Negative for polydipsia.  Psychiatric/Behavioral: Negative for depression.    Past Medical History:  Patient Active Problem List   Diagnosis Date Noted  . Vulvovaginitis, estrogen deficient 01/01/2020  . Chest pain 08/22/2015  . Allergic rhinitis 06/21/2015  . 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 Hypertension 06/21/2015  . Osteopenia 06/21/2015  . Herpes genitalia 10/09/2013  . MPA (microscopic polyangiitis) ([place]) 10/09/2013  . 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 Vasculitis ([place]) 12/18/2012    Past Surgical History:  Past Surgical History:  Procedure Laterality Date  . BREAST EXCISIONAL BIOPSY Right 1975   NEG  . COLONOSCOPY    . COLONOSCOPY WITH PROPOFOL N/A 12/07/2015   Procedure: COLONOSCOPY WITH  PROPOFOL;  Surgeon: Christene Lye, MD;  Location: [facility] ENDOSCOPY;  Service: Endoscopy;  Laterality: N/A;  . RENAL BIOPSY  2002, 2005   2 biopsies    Gynecologic History:  No LMP recorded. Patient is postmenopausal. Last Pap: Results were: 12/30/2018 NIL and HR HPV+  Last mammogram: 01/15/2019 Results were: BI-RAD I  Obstetric History: G4P4  Family History:  Family History  Problem Relation Age of Onset  . Cancer Mother        lung  . Kidney disease Mother   . Cancer Father        bone  . Breast cancer Neg Hx     Social History:  Social History   Socioeconomic History  . Marital status: Single    Spouse name: Not on file  . Number of children: Not on file  . Years of education: Not on file  . Highest education level: Not on file  Occupational History  . Not on file  Tobacco Use  . Smoking status: Never Smoker  . Smokeless tobacco: Never Used  Vaping Use  . Vaping Use: Never used  Substance and Sexual Activity  . Alcohol use: No  . Drug use: No  . Sexual activity: Yes    Birth control/protection: Post-menopausal  Other Topics Concern  . Not on file  Social History Narrative  . Not on file   Social Determinants of Health   Financial Resource Strain:   . Difficulty of Paying Living Expenses:   Food Insecurity:   . Worried About Charity fundraiser in the Last Year:   . [place] in the Last Year:  Transportation Needs:   . Film/video editor (Medical):   Marland Kitchen Lack of Transportation (Non-Medical):   Physical Activity:   . Days of Exercise per Week:   . Minutes of Exercise per Session:   Stress:   . Feeling of Stress :   Social Connections:   . Frequency of Communication with Friends and Family:   . Frequency of Social Gatherings with Friends and Family:   . Attends Religious Services:   . Active Member of Clubs or Organizations:   . Attends Archivist Meetings:   Marland Kitchen Marital Status:   Intimate Partner Violence:   . Fear of  Current or Ex-Partner:   . Emotionally Abused:   Marland Kitchen Physically Abused:   . Sexually Abused:     Allergies:  No Known Allergies  Medications: Prior to Admission medications   Medication Sig Start Date End Date Taking? Authorizing Provider  amLODipine (NORVASC) 10 MG tablet Take 10 mg by mouth daily.   Yes [provider]  aspirin EC 81 MG tablet Take by mouth.   Yes [provider]  atorvastatin (LIPITOR) 20 MG tablet  10/21/17  Yes [provider]  calcitRIOL (ROCALTROL) 0.25 MCG capsule Take 0.25 mcg by mouth daily.   Yes [provider]  furosemide (LASIX) 40 MG tablet Take 40 mg by mouth daily. 04/12/16  Yes [provider]  metoprolol tartrate (LOPRESSOR) 25 MG tablet Take 12.5 mg by mouth 2 (two) times daily.   Yes [provider]  Multiple Vitamin (MULTIVITAMIN) tablet Take 1 tablet by mouth daily.   Yes [provider]  ramipril (ALTACE) 10 MG capsule Take 10 mg by mouth 2 (two) times daily.    Yes [provider]  Simethicone (GAS RELIEF PO) Take by mouth as needed.   Yes [provider]  valACYclovir (VALTREX) 1000 MG tablet Take 1 tablet (1,000 mg total) by mouth 3 (three) times daily. 04/30/16  Yes Kathrine Haddock, NP  fluticasone (FLONASE) 50 MCG/ACT nasal spray 1 spray by Each Nare route daily. 11/30/16 11/30/17  [provider]    Physical Exam Vitals: Blood pressure (!) 142/86, pulse 71, height 5\' 6"  (1.676 m), weight 214 lb (97.1 kg).  General: NAD, well nourished appears stated HEENT: normocephalic, anicteric Thyroid: no enlargement, no palpable nodules Pulmonary: No increased work of breathing, CTAB Cardiovascular: RRR, distal pulses 2+ Breast: Breast symmetrical, no tenderness, no palpable nodules or masses, no skin or nipple retraction present, no nipple discharge.  No axillary or supraclavicular lymphadenopathy.  Just below the right breast on there is a small pedunculated  partially hyperpigmented lesion.   Abdomen: NABS, soft, non-tender, non-distended.  Umbilicus without lesions.  No hepatomegaly, splenomegaly or masses palpable. No evidence of hernia  Genitourinary:  External: Normal external female genitalia.  Normal urethral meatus, normal Bartholin's and Skene's glands.    Vagina: Normal vaginal mucosa, no evidence of prolapse.    Cervix: Grossly normal in appearance, no bleeding  Uterus: Non-enlarged, mobile, normal contour.  No CMT  Adnexa: ovaries non-enlarged, no adnexal masses  Rectal: deferred  Lymphatic: no evidence of inguinal lymphadenopathy Extremities: no edema, erythema, or tenderness Neurologic: Grossly intact Psychiatric: mood appropriate, affect full  Female chaperone present for pelvic and breast  portions of the physical exam  Physical Exam Chest:        SKIN BIOPSY NOTE The indications for skin biopsy were reviewed.   Risks of the biopsy including pain, bleeding, infection, inadequate specimen, scarring and need for additional  procedures  were discussed. The patient stated understanding and agreed to undergo procedure today. Consent was signed,  time out performed.  The patient's right chest wall was prepped with Betadine. 1% lidocaine was injected into the area for total volume of 66mL. The skin tag was raised with forceps and resected using an 11 blade scalpel  Small bleeding was noted and hemostasis was achieved using silver nitrate sticks.  The patient tolerated the procedure well. Post-procedure instructions  (pelvic rest for one week) were given to the patient. The patient is to call with heavy bleeding, fever greater than 100.4,  or other concerns. The patient will be called with results    Assessment: 64 y.o. G4P4 routine annual exam  Plan: Problem List Items Addressed This Visit      Genitourinary   Vulvovaginitis, estrogen deficient    Other Visit Diagnoses    Encounter for gynecological examination without  abnormal finding    -  Primary   Screening for malignant neoplasm of cervix       Relevant Orders   Cytology - PAP   Breast screening       Relevant Orders   MM 3D SCREEN BREAST BILATERAL   Osteoporosis screening       Relevant Orders   DG Bone Density   Inflamed skin tag       Relevant Orders   Surgical pathology      1) Mammogram - recommend yearly screening mammogram.  Mammogram Was ordered today  2) STI screening  was notoffered and therefore not obtained  3) ASCCP guidelines and rational discussed.  Patient opts for every 3 years screening interval - repeat today for NIL HPV positive pap last year  4) Osteoporosis  - per USPTF routine screening DEXA at age 2  5) Routine healthcare maintenance including cholesterol, diabetes screening discussed managed by PCP  6) Colonoscopy UTD  7) Skin tag - removed today.  If any significant dysplasia noted will refer to dermatology for wide local excision  8) Return in about 1 year (around 12/31/2020) for annual.    Malachy Mood, MD [facility] Pigeon, [facility] 01/01/2020, 9:16 AM

## 2020-01-04 LAB — CYTOLOGY - PAP
Comment: NEGATIVE
Diagnosis: NEGATIVE
High risk HPV: NEGATIVE

## 2020-01-05 LAB — SURGICAL PATHOLOGY

## 2020-01-08 ENCOUNTER — Other Ambulatory Visit: Payer: Self-pay | Admitting: Obstetrics and Gynecology

## 2020-01-08 DIAGNOSIS — R0602 Shortness of breath: Secondary | ICD-10-CM

## 2020-01-08 DIAGNOSIS — R071 Chest pain on breathing: Secondary | ICD-10-CM

## 2020-01-08 DIAGNOSIS — R1310 Dysphagia, unspecified: Secondary | ICD-10-CM

## 2020-01-08 NOTE — Progress Notes (Signed)
Called patient to go over biopsy results.  Patient reports intermittent chest pain, usually coinciding with swallowing but sometimes presents hours after eating.  Has not attempted antacids to see if relief.  Does report dyspnea with exertion.  No current chest pain.  Will arrange for cardiology referral.

## 2020-01-11 ENCOUNTER — Ambulatory Visit: Payer: BC Managed Care – PPO | Admitting: Cardiology

## 2020-01-11 ENCOUNTER — Encounter: Payer: Self-pay | Admitting: Cardiology

## 2020-01-11 ENCOUNTER — Other Ambulatory Visit: Payer: Self-pay

## 2020-01-11 VITALS — BP 150/90 | HR 98 | Ht 66.0 in | Wt 218.1 lb

## 2020-01-11 DIAGNOSIS — R079 Chest pain, unspecified: Secondary | ICD-10-CM

## 2020-01-11 DIAGNOSIS — I10 Essential (primary) hypertension: Secondary | ICD-10-CM

## 2020-01-11 DIAGNOSIS — R0609 Other forms of dyspnea: Secondary | ICD-10-CM

## 2020-01-11 DIAGNOSIS — R0600 Dyspnea, unspecified: Secondary | ICD-10-CM | POA: Diagnosis not present

## 2020-01-11 DIAGNOSIS — R0602 Shortness of breath: Secondary | ICD-10-CM

## 2020-01-11 NOTE — Patient Instructions (Addendum)
Medication Instructions:   Your physician recommends that you continue on your current medications as directed. Please refer to the Current Medication list given to you today.  *If you need a refill on your cardiac medications before your next appointment, please call your pharmacy*   Lab Work: None Ordered If you have labs (blood work) drawn today and your tests are completely normal, you will receive your results only by: Marland Kitchen MyChart Message (if you have MyChart) OR . A paper copy in the mail If you have any lab test that is abnormal or we need to change your treatment, we will call you to review the results.   Testing/Procedures:  Your physician has requested that you have an echocardiogram. Echocardiography is a painless test that uses sound waves to create images of your heart. It provides your doctor with information about the size and shape of your heart and how well your heart's chambers and valves are working. This procedure takes approximately one hour. There are no restrictions for this procedure.  Your physician has requested that you have a lexiscan myoview. For further information please visit [email or web address]. Please follow instruction sheet, as given.  [place]  Your caregiver has ordered a Stress Test with nuclear imaging. The purpose of this test is to evaluate the blood supply to your heart muscle. This procedure is referred to as a "Non-Invasive Stress Test." This is because other than having an IV started in your vein, nothing is inserted or "invades" your body. Cardiac stress tests are done to find areas of poor blood flow to the heart by determining the extent of coronary artery disease (CAD). Some patients exercise on a treadmill, which naturally increases the blood flow to your heart, while others who are  unable to walk on a treadmill due to physical limitations have a pharmacologic/chemical stress agent called Lexiscan . This medicine will mimic walking on a  treadmill by temporarily increasing your coronary blood flow.   Please note: these test may take anywhere between 2-4 hours to complete  PLEASE REPORT TO [place] AT THE FIRST DESK WILL DIRECT YOU WHERE TO GO  Date of Procedure:_____________________________________  Arrival Time for Procedure:______________________________  Instructions regarding medication:    __XX__:  Hold betablocker(s) night before procedure and morning of procedure (Lopressor)    PLEASE NOTIFY THE OFFICE AT LEAST 24 HOURS IN ADVANCE IF YOU ARE UNABLE TO KEEP YOUR APPOINTMENT.  [phone] AND  PLEASE NOTIFY NUCLEAR MEDICINE AT [facility] AT LEAST 24 HOURS IN ADVANCE IF YOU ARE UNABLE TO KEEP YOUR APPOINTMENT. [phone]  How to prepare for your Myoview test:  1. Do not eat or drink after midnight 2. No caffeine for 24 hours prior to test 3. No smoking 24 hours prior to test. 4. Your medication may be taken with water.  If your doctor stopped a medication because of this test, do not take that medication. 5. Ladies, please do not wear dresses.  Skirts or pants are appropriate. Please wear a short sleeve shirt. 6. No perfume, cologne or lotion. 7. Wear comfortable walking shoes. No heels!       Follow-Up: At [facility], you and your health needs are our priority.  As part of our continuing mission to provide you with exceptional heart care, we have created designated Provider Care Teams.  These Care Teams include your primary Cardiologist (physician) and Advanced Practice Providers (APPs -  Physician Assistants and Nurse Practitioners) who all work together to provide  you with the care you need, when you need it.  We recommend signing up for the patient portal called "MyChart".  Sign up information is provided on this After Visit Summary.  MyChart is used to connect with patients for Virtual Visits (Telemedicine).  Patients are able to view lab/test results, encounter notes,  upcoming appointments, etc.  Non-urgent messages can be sent to your provider as well.   To learn more about what you can do with MyChart, go to [email or web address].    Your next appointment:   Follow up after Lexiscan and Echocardiogram   The format for your next appointment:   Either In Person or Virtual  Provider:   Kate Sable, MD   Other Instructions   Echocardiogram An echocardiogram is a procedure that uses painless sound waves (ultrasound) to produce an image of the heart. Images from an echocardiogram can provide important information about:  Signs of coronary artery disease (CAD).  Aneurysm detection. An aneurysm is a weak or damaged part of an artery wall that bulges out from the normal force of blood pumping through the body.  Heart size and shape. Changes in the size or shape of the heart can be associated with certain conditions, including heart failure, aneurysm, and CAD.  Heart muscle function.  Heart valve function.  Signs of a past heart attack.  Fluid buildup around the heart.  Thickening of the heart muscle.  A tumor or infectious growth around the heart valves. Tell a health care provider about:  Any allergies you have.  All medicines you are taking, including vitamins, herbs, eye drops, creams, and over-the-counter medicines.  Any blood disorders you have.  Any surgeries you have had.  Any medical conditions you have.  Whether you are pregnant or may be pregnant. What are the risks? Generally, this is a safe procedure. However, problems may occur, including:  Allergic reaction to dye (contrast) that may be used during the procedure. What happens before the procedure? No specific preparation is needed. You may eat and drink normally. What happens during the procedure?   An IV tube may be inserted into one of your veins.  You may receive contrast through this tube. A contrast is an injection that improves the quality of the  pictures from your heart.  A gel will be applied to your chest.  A wand-like tool (transducer) will be moved over your chest. The gel will help to transmit the sound waves from the transducer.  The sound waves will harmlessly bounce off of your heart to allow the heart images to be captured in real-time motion. The images will be recorded on a computer. The procedure may vary among health care providers and hospitals. What happens after the procedure?  You may return to your normal, everyday life, including diet, activities, and medicines, unless your health care provider tells you not to do that. Summary  An echocardiogram is a procedure that uses painless sound waves (ultrasound) to produce an image of the heart.  Images from an echocardiogram can provide important information about the size and shape of your heart, heart muscle function, heart valve function, and fluid buildup around your heart.  You do not need to do anything to prepare before this procedure. You may eat and drink normally.  After the echocardiogram is completed, you may return to your normal, everyday life, unless your health care provider tells you not to do that. This information is not intended to replace advice given to you by your health  care provider. Make sure you discuss any questions you have with your health care provider. Document Revised: 10/23/2018 Document Reviewed: 08/04/2016 Elsevier Patient Education  [place].

## 2020-01-11 NOTE — Progress Notes (Signed)
Cardiology Office Note:    Date:  01/11/2020   ID:  Rebekah Mullins, DOB 1955-08-16, MRN [id number]  PCP:  Kathrine Haddock, NP  [facility] Cardiologist:  No primary care provider on file.  [facility] Electrophysiologist:  None   Referring MD: Malachy Mood, MD   Chief Complaint  Patient presents with  . OTHER    CP/pressure/SOB. Meds reviewed verbally with pt.   Rebekah Mullins is a 64 y.o. female who is being seen today for the evaluation of dyspnea on exertion at the request of Malachy Mood, MD.   History of Present Illness:    Rebekah Mullins is a 64 y.o. female with a hx of hypertension, hyperlipidemia, CKD who presents due to shortness of breath and chest pain.  Patient states having symptoms of shortness of breath and chest pain over the past 3 months.  Symptoms of shortness of breath typically occur with exertion and have worsened.  Symptoms are typically associated with chest discomfort.  Last occurrence was 3 days ago where patient was walking out of her car and suddenly had severe chest tightness, rating it as 9 out of 10 in severity, lasting 30 minutes.  She denies any history of heart disease.  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  Her blood pressure usually runs around 353I systolic.  Past Medical History:  Diagnosis Date  . Hyperlipidemia   . Hypertension   . Renal insufficiency   . Vasculitis ([place])   . Wagner syndrome     Past Surgical History:  Procedure Laterality Date  . BREAST EXCISIONAL BIOPSY Right 1975   NEG  . COLONOSCOPY    . COLONOSCOPY WITH PROPOFOL N/A 12/07/2015   Procedure: COLONOSCOPY WITH PROPOFOL;  Surgeon: Christene Lye, MD;  Location: [facility] ENDOSCOPY;  Service: Endoscopy;  Laterality: N/A;  . RENAL BIOPSY  2002, 2005   2 biopsies    Current Medications: Current Meds  Medication Sig  . amLODipine (NORVASC) 10 MG tablet Take 10 mg by mouth daily.  Marland Kitchen aspirin EC 81 MG tablet Take by mouth.  Marland Kitchen atorvastatin  (LIPITOR) 20 MG tablet   . calcitRIOL (ROCALTROL) 0.25 MCG capsule Take 0.25 mcg by mouth daily.  . chlorthalidone (HYGROTON) 25 MG tablet Take 12.5 mg by mouth daily.  . metoprolol tartrate (LOPRESSOR) 25 MG tablet Take 25 mg by mouth 2 (two) times daily.   . Multiple Vitamin (MULTIVITAMIN) tablet Take 2 tablets by mouth daily.   . ramipril (ALTACE) 10 MG capsule Take 10 mg by mouth 2 (two) times daily.   . valACYclovir (VALTREX) 1000 MG tablet Take 1 tablet (1,000 mg total) by mouth 3 (three) times daily. (Patient taking differently: Take 1,000 mg by mouth daily. )     Allergies:   Patient has no known allergies.   Social History   Socioeconomic History  . Marital status: Single    Spouse name: Not on file  . Number of children: Not on file  . Years of education: Not on file  . Highest education level: Not on file  Occupational History  . Not on file  Tobacco Use  . Smoking status: Never Smoker  . Smokeless tobacco: Never Used  Vaping Use  . Vaping Use: Never used  Substance and Sexual Activity  . Alcohol use: No  . Drug use: No  . Sexual activity: Yes    Birth control/protection: Post-menopausal  Other Topics Concern  . Not on file  Social History Narrative  .  Not on file   Social Determinants of Health   Financial Resource Strain:   . Difficulty of Paying Living Expenses:   Food Insecurity:   . Worried About Charity fundraiser in the Last Year:   . Arboriculturist in the Last Year:   Transportation Needs:   . Film/video editor (Medical):   Marland Kitchen Lack of Transportation (Non-Medical):   Physical Activity:   . Days of Exercise per Week:   . Minutes of Exercise per Session:   Stress:   . Feeling of Stress :   Social Connections:   . Frequency of Communication with Friends and Family:   . Frequency of Social Gatherings with Friends and Family:   . Attends Religious Services:   . Active Member of Clubs or Organizations:   . Attends Archivist  Meetings:   Marland Kitchen Marital Status:      Family History: The patient's family history includes Cancer in her father and mother; Heart Problems in her maternal grandmother; Kidney disease in her mother. There is no history of Breast cancer.  ROS:   Please see the history of present illness.     All other systems reviewed and are negative.  EKGs/Labs/Other Studies Reviewed:    The following studies were reviewed today:   EKG:  EKG is  ordered today.  The ekg ordered today demonstrates normal sinus rhythm, normal ECG.  Recent Labs: No results found for requested labs within last 8760 hours.  Recent Lipid Panel No results found for: CHOL, TRIG, HDL, CHOLHDL, VLDL, LDLCALC, LDLDIRECT  Physical Exam:    VS:  BP (!) 150/90 (BP Location: Right Arm, Patient Position: Sitting, Cuff Size: Normal)   Pulse 98   Ht 5\' 6"  (1.676 m)   Wt 218 lb 2 oz (98.9 kg)   SpO2 98%   BMI 35.21 kg/m     Wt Readings from Last 3 Encounters:  01/11/20 218 lb 2 oz (98.9 kg)  01/01/20 214 lb (97.1 kg)  12/30/18 212 lb (96.2 kg)     GEN:  Well nourished, well developed in no acute distress HEENT: Normal NECK: No JVD; No carotid bruits LYMPHATICS: No lymphadenopathy CARDIAC: RRR, no murmurs, rubs, gallops RESPIRATORY:  Clear to auscultation without rales, wheezing or rhonchi  ABDOMEN: Soft, non-tender, non-distended MUSCULOSKELETAL:  No edema; No deformity  SKIN: Warm and dry NEUROLOGIC:  Alert and oriented x 3 PSYCHIATRIC:  Normal affect   ASSESSMENT:    1. Chest pain of uncertain etiology   2. Dyspnea on exertion   3. Essential hypertension   4. Chest pain, unspecified type   5. Shortness of breath    PLAN:    In order of problems listed above:  1. Patient with history of nonspecific chest discomfort.  Has risk factors of hypertension, hyperlipidemia.  Will get echocardiogram and Lexiscan Myoview to evaluate cardiac function.  Avoiding coronary CTA due to history of CKD/vasculitis. 2. Patient  will dyspnea on exertion, get echocardiogram as above to evaluate cardiac function. 3. History of hypertension, BP usually controlled but elevated today.  Continue to monitor on follow-up.  If stays elevated at follow-up visit, will consider increasing ramipril dosage.  Follow-up after echo and Lexiscan.  Follow-up visit could be virtual as patient may be moving to [place] soon.  Medication Adjustments/Labs and Tests Ordered: Current medicines are reviewed at length with the patient today.  Concerns regarding medicines are outlined above.  Orders Placed This Encounter  Procedures  . NM  Myocar Multi W/Spect W/Wall Motion / EF  . EKG 12-Lead  . ECHOCARDIOGRAM COMPLETE   No orders of the defined types were placed in this encounter.   Patient Instructions  Medication Instructions:   Your physician recommends that you continue on your current medications as directed. Please refer to the Current Medication list given to you today.  *If you need a refill on your cardiac medications before your next appointment, please call your pharmacy*   Lab Work: None Ordered If you have labs (blood work) drawn today and your tests are completely normal, you will receive your results only by: Marland Kitchen MyChart Message (if you have MyChart) OR . A paper copy in the mail If you have any lab test that is abnormal or we need to change your treatment, we will call you to review the results.   Testing/Procedures:  Your physician has requested that you have an echocardiogram. Echocardiography is a painless test that uses sound waves to create images of your heart. It provides your doctor with information about the size and shape of your heart and how well your heart's chambers and valves are working. This procedure takes approximately one hour. There are no restrictions for this procedure.  Your physician has requested that you have a lexiscan myoview. For further information please visit [email or web address].  Please follow instruction sheet, as given.  [place]  Your caregiver has ordered a Stress Test with nuclear imaging. The purpose of this test is to evaluate the blood supply to your heart muscle. This procedure is referred to as a "Non-Invasive Stress Test." This is because other than having an IV started in your vein, nothing is inserted or "invades" your body. Cardiac stress tests are done to find areas of poor blood flow to the heart by determining the extent of coronary artery disease (CAD). Some patients exercise on a treadmill, which naturally increases the blood flow to your heart, while others who are  unable to walk on a treadmill due to physical limitations have a pharmacologic/chemical stress agent called Lexiscan . This medicine will mimic walking on a treadmill by temporarily increasing your coronary blood flow.   Please note: these test may take anywhere between 2-4 hours to complete  PLEASE REPORT TO [place] AT THE FIRST DESK WILL DIRECT YOU WHERE TO GO  Date of Procedure:_____________________________________  Arrival Time for Procedure:______________________________  Instructions regarding medication:    __XX__:  Hold betablocker(s) night before procedure and morning of procedure (Lopressor)    PLEASE NOTIFY THE OFFICE AT LEAST 24 HOURS IN ADVANCE IF YOU ARE UNABLE TO KEEP YOUR APPOINTMENT.  [phone] AND  PLEASE NOTIFY NUCLEAR MEDICINE AT [facility] AT LEAST 24 HOURS IN ADVANCE IF YOU ARE UNABLE TO KEEP YOUR APPOINTMENT. [phone]  How to prepare for your Myoview test:  1. Do not eat or drink after midnight 2. No caffeine for 24 hours prior to test 3. No smoking 24 hours prior to test. 4. Your medication may be taken with water.  If your doctor stopped a medication because of this test, do not take that medication. 5. Ladies, please do not wear dresses.  Skirts or pants are appropriate. Please wear a short sleeve shirt. 6. No  perfume, cologne or lotion. 7. Wear comfortable walking shoes. No heels!       Follow-Up: At [facility], you and your health needs are our priority.  As part of our continuing mission to provide you with exceptional heart care,  we have created designated Provider Care Teams.  These Care Teams include your primary Cardiologist (physician) and Advanced Practice Providers (APPs -  Physician Assistants and Nurse Practitioners) who all work together to provide you with the care you need, when you need it.  We recommend signing up for the patient portal called "MyChart".  Sign up information is provided on this After Visit Summary.  MyChart is used to connect with patients for Virtual Visits (Telemedicine).  Patients are able to view lab/test results, encounter notes, upcoming appointments, etc.  Non-urgent messages can be sent to your provider as well.   To learn more about what you can do with MyChart, go to [email or web address].    Your next appointment:   Follow up after Lexiscan and Echocardiogram   The format for your next appointment:   Either In Person or Virtual  Provider:   Kate Sable, MD   Other Instructions   Echocardiogram An echocardiogram is a procedure that uses painless sound waves (ultrasound) to produce an image of the heart. Images from an echocardiogram can provide important information about:  Signs of coronary artery disease (CAD).  Aneurysm detection. An aneurysm is a weak or damaged part of an artery wall that bulges out from the normal force of blood pumping through the body.  Heart size and shape. Changes in the size or shape of the heart can be associated with certain conditions, including heart failure, aneurysm, and CAD.  Heart muscle function.  Heart valve function.  Signs of a past heart attack.  Fluid buildup around the heart.  Thickening of the heart muscle.  A tumor or infectious growth around the heart valves. Tell a health care  provider about:  Any allergies you have.  All medicines you are taking, including vitamins, herbs, eye drops, creams, and over-the-counter medicines.  Any blood disorders you have.  Any surgeries you have had.  Any medical conditions you have.  Whether you are pregnant or may be pregnant. What are the risks? Generally, this is a safe procedure. However, problems may occur, including:  Allergic reaction to dye (contrast) that may be used during the procedure. What happens before the procedure? No specific preparation is needed. You may eat and drink normally. What happens during the procedure?   An IV tube may be inserted into one of your veins.  You may receive contrast through this tube. A contrast is an injection that improves the quality of the pictures from your heart.  A gel will be applied to your chest.  A wand-like tool (transducer) will be moved over your chest. The gel will help to transmit the sound waves from the transducer.  The sound waves will harmlessly bounce off of your heart to allow the heart images to be captured in real-time motion. The images will be recorded on a computer. The procedure may vary among health care providers and hospitals. What happens after the procedure?  You may return to your normal, everyday life, including diet, activities, and medicines, unless your health care provider tells you not to do that. Summary  An echocardiogram is a procedure that uses painless sound waves (ultrasound) to produce an image of the heart.  Images from an echocardiogram can provide important information about the size and shape of your heart, heart muscle function, heart valve function, and fluid buildup around your heart.  You do not need to do anything to prepare before this procedure. You may eat and drink normally.  After the echocardiogram is completed,  you may return to your normal, everyday life, unless your health care provider tells you not to do  that. This information is not intended to replace advice given to you by your health care provider. Make sure you discuss any questions you have with your health care provider. Document Revised: 10/23/2018 Document Reviewed: 08/04/2016 Elsevier Patient Education  2020 [place], Kate Sable, MD  01/11/2020 5:18 PM    [facility]

## 2020-01-15 ENCOUNTER — Ambulatory Visit: Payer: BC Managed Care – PPO | Admitting: Cardiology

## 2020-01-21 ENCOUNTER — Ambulatory Visit
Admission: RE | Admit: 2020-01-21 | Discharge: 2020-01-21 | Disposition: A | Discharge status: Home / Self Care | Payer: Medicare PPO | Source: Ambulatory Visit | Attending: Obstetrics and Gynecology | Admitting: Obstetrics and Gynecology

## 2020-01-21 DIAGNOSIS — Z1231 Encounter for screening mammogram for malignant neoplasm of breast: Secondary | ICD-10-CM | POA: Diagnosis not present

## 2020-01-21 DIAGNOSIS — Z13820 Encounter for screening for osteoporosis: Secondary | ICD-10-CM | POA: Diagnosis present

## 2020-01-21 DIAGNOSIS — Z780 Asymptomatic menopausal state: Secondary | ICD-10-CM | POA: Insufficient documentation

## 2020-01-21 DIAGNOSIS — R0602 Shortness of breath: Secondary | ICD-10-CM | POA: Insufficient documentation

## 2020-01-21 DIAGNOSIS — R079 Chest pain, unspecified: Secondary | ICD-10-CM | POA: Insufficient documentation

## 2020-01-22 ENCOUNTER — Ambulatory Visit (HOSPITAL_BASED_OUTPATIENT_CLINIC_OR_DEPARTMENT_OTHER)
Admission: RE | Admit: 2020-01-22 | Discharge: 2020-01-22 | Disposition: A | Discharge status: Home / Self Care | Payer: Medicare PPO | Source: Ambulatory Visit | Attending: Cardiology | Admitting: Cardiology

## 2020-01-22 ENCOUNTER — Other Ambulatory Visit: Payer: Self-pay

## 2020-01-22 DIAGNOSIS — R079 Chest pain, unspecified: Secondary | ICD-10-CM | POA: Diagnosis not present

## 2020-01-22 DIAGNOSIS — R0602 Shortness of breath: Secondary | ICD-10-CM

## 2020-01-22 LAB — NM MYOCAR MULTI W/SPECT W/WALL MOTION / EF
Estimated workload: 1 METS
Exercise duration (min): 0 min
Exercise duration (sec): 0 s
LV dias vol: 53 mL (ref 46–106)
LV sys vol: 16 mL
MPHR: 156 {beats}/min
Peak HR: 142 {beats}/min
Percent HR: 91 %
Rest HR: 79 {beats}/min
SDS: 4
SRS: 3
SSS: 5
TID: 0.8

## 2020-01-22 MED ORDER — NITROGLYCERIN 0.4 MG SL SUBL
0.4000 mg | SUBLINGUAL_TABLET | SUBLINGUAL | Status: AC | PRN
  Administered 2020-01-22 (×2): 0.4 mg via SUBLINGUAL

## 2020-01-22 MED ORDER — REGADENOSON 0.4 MG/5ML IV SOLN
0.4000 mg | Freq: Once | INTRAVENOUS | Status: AC
  Administered 2020-01-22: 0.4 mg via INTRAVENOUS

## 2020-01-22 MED ORDER — TECHNETIUM TC 99M TETROFOSMIN IV KIT
30.0000 | PACK | Freq: Once | INTRAVENOUS | Status: AC | PRN
  Administered 2020-01-22: 32.61 via INTRAVENOUS

## 2020-01-22 MED ORDER — TECHNETIUM TC 99M TETROFOSMIN IV KIT
10.0000 | PACK | Freq: Once | INTRAVENOUS | Status: AC | PRN
  Administered 2020-01-22: 10.4 via INTRAVENOUS

## 2020-02-11 ENCOUNTER — Ambulatory Visit (INDEPENDENT_AMBULATORY_CARE_PROVIDER_SITE_OTHER): Payer: Medicare PPO

## 2020-02-11 ENCOUNTER — Other Ambulatory Visit: Payer: Self-pay

## 2020-02-11 ENCOUNTER — Ambulatory Visit (INDEPENDENT_AMBULATORY_CARE_PROVIDER_SITE_OTHER): Payer: Medicare PPO | Admitting: Cardiology

## 2020-02-11 ENCOUNTER — Encounter: Payer: Self-pay | Admitting: Cardiology

## 2020-02-11 VITALS — BP 120/80 | HR 62 | Ht 67.0 in | Wt 218.0 lb

## 2020-02-11 DIAGNOSIS — R0609 Other forms of dyspnea: Secondary | ICD-10-CM

## 2020-02-11 DIAGNOSIS — R079 Chest pain, unspecified: Secondary | ICD-10-CM

## 2020-02-11 DIAGNOSIS — R0600 Dyspnea, unspecified: Secondary | ICD-10-CM | POA: Diagnosis not present

## 2020-02-11 DIAGNOSIS — I10 Essential (primary) hypertension: Secondary | ICD-10-CM

## 2020-02-11 DIAGNOSIS — R0602 Shortness of breath: Secondary | ICD-10-CM | POA: Diagnosis not present

## 2020-02-11 MED ORDER — PANTOPRAZOLE SODIUM 40 MG PO TBEC: 40.0000 mg | DELAYED_RELEASE_TABLET | Freq: Every day | ORAL | 6 refills | Status: DC

## 2020-02-11 MED ORDER — PERFLUTREN LIPID MICROSPHERE
1.0000 mL | INTRAVENOUS | Status: AC | PRN
  Administered 2020-02-11: 2 mL via INTRAVENOUS

## 2020-02-11 NOTE — Progress Notes (Signed)
Cardiology Office Note:    Date:  02/11/2020   ID:  Rebekah Mullins, DOB [birth year], MRN [id number]  PCP:  Rebekah Haddock, NP  [facility] Cardiologist:  No primary care provider on file.  [facility] Electrophysiologist:  None   Referring MD: Rebekah Haddock, NP   Chief Complaint  Patient presents with  . OTHER    F/u echo/myoview c/o occassional chest pain and sob. Meds reviewed verbally with pt.     History of Present Illness:    Rebekah Mullins is a 64 y.o. female with a hx of hypertension, hyperlipidemia, CKD who presents for follow-up.  She was last seen due to shortness of breath and chest pain.  Symptoms were concerning for presence of CAD.  Echocardiogram and Lexiscan Myoview stress test were ordered to evaluate patient's symptoms.  She also history of vasculitis and follows up at [facility] for frequent infusions.  She lives in [place] and commutes 5 hours to come to her appointment.  She had her echocardiogram performed today.  She still has occasional chest discomfort related with exertion.  She states having a history of reflux years ago, to concurrently for therapy for focusing currently stopped.  She denies worsening chest discomfort when laying down, denies reproducible pain with palpation.   Past Medical History:  Diagnosis Date  . Hyperlipidemia   . Hypertension   . Renal insufficiency   . Vasculitis ([place])   . Wagner syndrome     Past Surgical History:  Procedure Laterality Date  . BREAST EXCISIONAL BIOPSY Right 1975   NEG  . COLONOSCOPY    . COLONOSCOPY WITH PROPOFOL N/A 12/07/2015   Procedure: COLONOSCOPY WITH PROPOFOL;  Surgeon: Rebekah Lye, MD;  Location: [facility] ENDOSCOPY;  Service: Endoscopy;  Laterality: N/A;  . RENAL BIOPSY  2002, 2005   2 biopsies    Current Medications: Current Meds  Medication Sig  . amLODipine (NORVASC) 10 MG tablet Take 10 mg by mouth daily.  Marland Kitchen aspirin EC 81 MG tablet Take by mouth.  Marland Kitchen atorvastatin (LIPITOR) 20 MG tablet    . calcitRIOL (ROCALTROL) 0.25 MCG capsule Take 0.25 mcg by mouth daily.  . chlorthalidone (HYGROTON) 25 MG tablet Take 12.5 mg by mouth daily.  . metoprolol tartrate (LOPRESSOR) 25 MG tablet Take 25 mg by mouth 2 (two) times daily.   . Multiple Vitamin (MULTIVITAMIN) tablet Take 2 tablets by mouth daily.   . ramipril (ALTACE) 10 MG capsule Take 10 mg by mouth 2 (two) times daily.   . valACYclovir (VALTREX) 1000 MG tablet Take 1 tablet (1,000 mg total) by mouth 3 (three) times daily. (Patient taking differently: Take 1,000 mg by mouth daily. )   Current Facility-Administered Medications for the 02/11/20 encounter (Office Visit) with Kate Sable, MD  Medication  . perflutren lipid microspheres (DEFINITY) IV suspension     Allergies:   Patient has no known allergies.   Social History   Socioeconomic History  . Marital status: Single    Spouse name: Not on file  . Number of children: Not on file  . Years of education: Not on file  . Highest education level: Not on file  Occupational History  . Not on file  Tobacco Use  . Smoking status: Never Smoker  . Smokeless tobacco: Never Used  Vaping Use  . Vaping Use: Never used  Substance and Sexual Activity  . Alcohol use: No  . Drug use: No  . Sexual activity: Yes    Birth control/protection: Post-menopausal  Other Topics Concern  . Not on file  Social History Narrative  . Not on file   Social Determinants of Health   Financial Resource Strain:   . Difficulty of Paying Living Expenses:   Food Insecurity:   . Worried About Charity fundraiser in the Last Year:   . Arboriculturist in the Last Year:   Transportation Needs:   . Film/video editor (Medical):   Marland Kitchen Lack of Transportation (Non-Medical):   Physical Activity:   . Days of Exercise per Week:   . Minutes of Exercise per Session:   Stress:   . Feeling of Stress :   Social Connections:   . Frequency of Communication with Friends and Family:   . Frequency of  Social Gatherings with Friends and Family:   . Attends Religious Services:   . Active Member of Clubs or Organizations:   . Attends Archivist Meetings:   Marland Kitchen Marital Status:      Family History: The patient's family history includes Cancer in her father and mother; Heart Problems in her maternal grandmother; Kidney disease in her mother. There is no history of Breast cancer.  ROS:   Please see the history of present illness.     All other systems reviewed and are negative.  EKGs/Labs/Other Studies Reviewed:    The following studies were reviewed today:   EKG:  EKG is  ordered today.  The ekg ordered today demonstrates normal sinus rhythm, normal ECG.  Recent Labs: No results found for requested labs within last 8760 hours.  Recent Lipid Panel No results found for: CHOL, TRIG, HDL, CHOLHDL, VLDL, LDLCALC, LDLDIRECT  Physical Exam:    VS:  BP 120/80 (BP Location: Left Arm, Patient Position: Sitting, Cuff Size: Normal)   Pulse 62   Ht 5\' 7"  (1.702 m)   Wt (!) 218 lb (98.9 kg)   SpO2 98%   BMI 34.14 kg/m     Wt Readings from Last 3 Encounters:  02/11/20 (!) 218 lb (98.9 kg)  01/11/20 218 lb 2 oz (98.9 kg)  01/01/20 214 lb (97.1 kg)     GEN:  Well nourished, well developed in no acute distress HEENT: Normal NECK: No JVD; No carotid bruits LYMPHATICS: No lymphadenopathy CARDIAC: RRR, no murmurs, rubs, gallops RESPIRATORY:  Clear to auscultation without rales, wheezing or rhonchi  ABDOMEN: Soft, non-tender, non-distended MUSCULOSKELETAL:  No edema; No deformity  SKIN: Warm and dry NEUROLOGIC:  Alert and oriented x 3 PSYCHIATRIC:  Normal affect   ASSESSMENT:    1. Chest pain of uncertain etiology   2. Dyspnea on exertion   3. Essential hypertension    PLAN:    In order of problems listed above:  1. Patient with history of nonspecific chest discomfort.  Has risk factors of hypertension, hyperlipidemia.  Lexiscan Myoview with no evidence for ischemia,  low risk scan.  Echocardiogram performed today, reviewed by myself showed normal systolic and diastolic function, EF 15%.  Patient made aware of cardiac test results.  Avoiding anatomic testing/coronary CTA due to history of CKD.  She has a history of reflux, will start a trial of Protonix 40 mg to see if this will help her symptoms. 2. Patient will dyspnea on exertion, get echocardiogram as above to evaluate cardiac function. 3. History of hypertension, BP usually controlled but elevated today.  Continue to monitor on follow-up.  If stays elevated at follow-up visit, will consider increasing ramipril dosage.  Follow-up in 3 to 4  weeks, virtually.  Total encounter time 40 minutes  Greater than 50% was spent in counseling and coordination of care with the patient   Medication Adjustments/Labs and Tests Ordered: Current medicines are reviewed at length with the patient today.  Concerns regarding medicines are outlined above.  Orders Placed This Encounter  Procedures  . EKG 12-Lead   Meds ordered this encounter  Medications  . pantoprazole (PROTONIX) 40 MG tablet    Sig: Take 1 tablet (40 mg total) by mouth daily.    Dispense:  30 tablet    Refill:  6    Patient Instructions  Medication Instructions:   Your physician has recommended you make the following change in your medication:   START: Protonix 40mg : Take 1 tablet (40 mg total) by mouth daily.   *If you need a refill on your cardiac medications before your next appointment, please call your pharmacy*   Lab Work: None Ordered If you have labs (blood work) drawn today and your tests are completely normal, you will receive your results only by: Marland Kitchen MyChart Message (if you have MyChart) OR . A paper copy in the mail If you have any lab test that is abnormal or we need to change your treatment, we will call you to review the results.   Testing/Procedures: None Ordered   Follow-Up: At [facility], you and your health  needs are our priority.  As part of our continuing mission to provide you with exceptional heart care, we have created designated Provider Care Teams.  These Care Teams include your primary Cardiologist (physician) and Advanced Practice Providers (APPs -  Physician Assistants and Nurse Practitioners) who all work together to provide you with the care you need, when you need it.  We recommend signing up for the patient portal called "MyChart".  Sign up information is provided on this After Visit Summary.  MyChart is used to connect with patients for Virtual Visits (Telemedicine).  Patients are able to view lab/test results, encounter notes, upcoming appointments, etc.  Non-urgent messages can be sent to your provider as well.   To learn more about what you can do with MyChart, go to [email or web address].    Your next appointment:   3-4 week(s)  The format for your next appointment:   Phone Visit  Provider:   Kate Sable, MD   Other Instructions Pantoprazole tablets What is this medicine? PANTOPRAZOLE (pan TOE pra zole) prevents the production of acid in the stomach. It is used to treat gastroesophageal reflux disease (GERD), inflammation of the esophagus, and Zollinger-Ellison syndrome. This medicine may be used for other purposes; ask your health care provider or pharmacist if you have questions. COMMON BRAND NAME(S): Protonix What should I tell my health care provider before I take this medicine? They need to know if you have any of these conditions:  liver disease  low levels of magnesium in the blood  lupus  an unusual or allergic reaction to omeprazole, lansoprazole, pantoprazole, rabeprazole, other medicines, foods, dyes, or preservatives  pregnant or trying to get pregnant  breast-feeding How should I use this medicine? Take this medicine by mouth. Swallow the tablets whole with a drink of water. Follow the directions on the prescription label. Do not crush, break,  or chew. Take your medicine at regular intervals. Do not take your medicine more often than directed. Talk to your pediatrician regarding the use of this medicine in children. While this drug may be prescribed for children as young as 5 years for selected  conditions, precautions do apply. Overdosage: If you think you have taken too much of this medicine contact a poison control center or emergency room at once. NOTE: This medicine is only for you. Do not share this medicine with others. What if I miss a dose? If you miss a dose, take it as soon as you can. If it is almost time for your next dose, take only that dose. Do not take double or extra doses. What may interact with this medicine? Do not take this medicine with any of the following medications:  atazanavir  nelfinavir This medicine may also interact with the following medications:  ampicillin  delavirdine  erlotinib  iron salts  medicines for fungal infections like ketoconazole, itraconazole and voriconazole  methotrexate  mycophenolate mofetil  warfarin This list may not describe all possible interactions. Give your health care provider a list of all the medicines, herbs, non-prescription drugs, or dietary supplements you use. Also tell them if you smoke, drink alcohol, or use illegal drugs. Some items may interact with your medicine. What should I watch for while using this medicine? It can take several days before your stomach pain gets better. Check with your doctor or health care provider if your condition does not start to get better, or if it gets worse. This medicine may cause serious skin reactions. They can happen weeks to months after starting the medicine. Contact your health care provider right away if you notice fevers or flu-like symptoms with a rash. The rash may be red or purple and then turn into blisters or peeling of the skin. Or, you might notice a red rash with swelling of the face, lips or lymph nodes in  your neck or under your arms. You may need blood work done while you are taking this medicine. This medicine may cause a decrease in vitamin B12. You should make sure that you get enough vitamin B12 while you are taking this medicine. Discuss the foods you eat and the vitamins you take with your health care provider. What side effects may I notice from receiving this medicine? Side effects that you should report to your doctor or health care professional as soon as possible:   allergic reactions like skin rash, itching or hives, swelling of the face, lips, or tongue   bone, muscle or joint pain   breathing problems   chest pain or chest tightness   dark yellow or brown urine   dizziness   fast, irregular heartbeat   feeling faint or lightheaded   fever or sore throat   muscle spasm   palpitations   rash on cheeks or arms that gets worse in the sun   redness, blistering, peeling or loosening of the skin, including inside the mouth   seizures  stomach polyps   tremors   unusual bleeding or bruising   unusually weak or tired   yellowing of the eyes or skin Side effects that usually do not require medical attention (report to your doctor or health care professional if they continue or are bothersome):   constipation   diarrhea   dry mouth   headache   nausea This list may not describe all possible side effects. Call your doctor for medical advice about side effects. You may report side effects to FDA at 1-800-FDA-1088. Where should I keep my medicine? Keep out of the reach of children. Store at room temperature between 15 and 30 degrees C (59 and 86 degrees F). Protect from light and moisture.  Throw away any unused medicine after the expiration date. NOTE: This sheet is a summary. It may not cover all possible information. If you have questions about this medicine, talk to your doctor, pharmacist, or health care provider.  2020  Elsevier/Gold Standard (2018-10-10 13:18:32)      Signed, Kate Sable, MD  02/11/2020 12:34 PM    [place]

## 2020-02-11 NOTE — Patient Instructions (Addendum)
Medication Instructions:   Your physician has recommended you make the following change in your medication:   START: Protonix 40mg : Take 1 tablet (40 mg total) by mouth daily.   *If you need a refill on your cardiac medications before your next appointment, please call your pharmacy*   Lab Work: None Ordered If you have labs (blood work) drawn today and your tests are completely normal, you will receive your results only by: Marland Kitchen MyChart Message (if you have MyChart) OR . A paper copy in the mail If you have any lab test that is abnormal or we need to change your treatment, we will call you to review the results.   Testing/Procedures: None Ordered   Follow-Up: At [facility], you and your health needs are our priority.  As part of our continuing mission to provide you with exceptional heart care, we have created designated Provider Care Teams.  These Care Teams include your primary Cardiologist (physician) and Advanced Practice Providers (APPs -  Physician Assistants and Nurse Practitioners) who all work together to provide you with the care you need, when you need it.  We recommend signing up for the patient portal called "MyChart".  Sign up information is provided on this After Visit Summary.  MyChart is used to connect with patients for Virtual Visits (Telemedicine).  Patients are able to view lab/test results, encounter notes, upcoming appointments, etc.  Non-urgent messages can be sent to your provider as well.   To learn more about what you can do with MyChart, go to [email or web address].    Your next appointment:   3-4 week(s)  The format for your next appointment:   Phone Visit  Provider:   Kate Sable, MD   Other Instructions Pantoprazole tablets What is this medicine? PANTOPRAZOLE (pan TOE pra zole) prevents the production of acid in the stomach. It is used to treat gastroesophageal reflux disease (GERD), inflammation of the esophagus, and Zollinger-Ellison  syndrome. This medicine may be used for other purposes; ask your health care provider or pharmacist if you have questions. COMMON BRAND NAME(S): Protonix What should I tell my health care provider before I take this medicine? They need to know if you have any of these conditions:  liver disease  low levels of magnesium in the blood  lupus  an unusual or allergic reaction to omeprazole, lansoprazole, pantoprazole, rabeprazole, other medicines, foods, dyes, or preservatives  pregnant or trying to get pregnant  breast-feeding How should I use this medicine? Take this medicine by mouth. Swallow the tablets whole with a drink of water. Follow the directions on the prescription label. Do not crush, break, or chew. Take your medicine at regular intervals. Do not take your medicine more often than directed. Talk to your pediatrician regarding the use of this medicine in children. While this drug may be prescribed for children as young as 5 years for selected conditions, precautions do apply. Overdosage: If you think you have taken too much of this medicine contact a poison control center or emergency room at once. NOTE: This medicine is only for you. Do not share this medicine with others. What if I miss a dose? If you miss a dose, take it as soon as you can. If it is almost time for your next dose, take only that dose. Do not take double or extra doses. What may interact with this medicine? Do not take this medicine with any of the following medications:  atazanavir  nelfinavir This medicine may also interact with the  following medications:  ampicillin  delavirdine  erlotinib  iron salts  medicines for fungal infections like ketoconazole, itraconazole and voriconazole  methotrexate  mycophenolate mofetil  warfarin This list may not describe all possible interactions. Give your health care provider a list of all the medicines, herbs, non-prescription drugs, or dietary  supplements you use. Also tell them if you smoke, drink alcohol, or use illegal drugs. Some items may interact with your medicine. What should I watch for while using this medicine? It can take several days before your stomach pain gets better. Check with your doctor or health care provider if your condition does not start to get better, or if it gets worse. This medicine may cause serious skin reactions. They can happen weeks to months after starting the medicine. Contact your health care provider right away if you notice fevers or flu-like symptoms with a rash. The rash may be red or purple and then turn into blisters or peeling of the skin. Or, you might notice a red rash with swelling of the face, lips or lymph nodes in your neck or under your arms. You may need blood work done while you are taking this medicine. This medicine may cause a decrease in vitamin B12. You should make sure that you get enough vitamin B12 while you are taking this medicine. Discuss the foods you eat and the vitamins you take with your health care provider. What side effects may I notice from receiving this medicine? Side effects that you should report to your doctor or health care professional as soon as possible:   allergic reactions like skin rash, itching or hives, swelling of the face, lips, or tongue   bone, muscle or joint pain   breathing problems   chest pain or chest tightness   dark yellow or brown urine   dizziness   fast, irregular heartbeat   feeling faint or lightheaded   fever or sore throat   muscle spasm   palpitations   rash on cheeks or arms that gets worse in the sun   redness, blistering, peeling or loosening of the skin, including inside the mouth   seizures  stomach polyps   tremors   unusual bleeding or bruising   unusually weak or tired   yellowing of the eyes or skin Side effects that usually do not require medical attention (report to your doctor  or health care professional if they continue or are bothersome):   constipation   diarrhea   dry mouth   headache   nausea This list may not describe all possible side effects. Call your doctor for medical advice about side effects. You may report side effects to FDA at 1-800-FDA-1088. Where should I keep my medicine? Keep out of the reach of children. Store at room temperature between 15 and 30 degrees C (59 and 86 degrees F). Protect from light and moisture. Throw away any unused medicine after the expiration date. NOTE: This sheet is a summary. It may not cover all possible information. If you have questions about this medicine, talk to your doctor, pharmacist, or health care provider.  2020 Elsevier/Gold Standard (2018-10-10 13:18:32)

## 2020-02-12 LAB — ECHOCARDIOGRAM COMPLETE
AR max vel: 3.23 cm2
AV Area VTI: 3.64 cm2
AV Area mean vel: 3.4 cm2
AV Mean grad: 2 mmHg
AV Peak grad: 4.2 mmHg
Ao pk vel: 1.03 m/s
Area-P 1/2: 4.86 cm2
S' Lateral: 2.3 cm
Single Plane A4C EF: 67.1 %

## 2020-03-07 ENCOUNTER — Encounter: Payer: Self-pay | Admitting: Cardiology

## 2020-03-07 ENCOUNTER — Ambulatory Visit: Payer: Medicare PPO | Admitting: Cardiology

## 2020-03-07 ENCOUNTER — Other Ambulatory Visit: Payer: Self-pay

## 2020-03-07 VITALS — BP 140/90 | HR 69 | Ht 67.0 in | Wt 214.0 lb

## 2020-03-07 DIAGNOSIS — I10 Essential (primary) hypertension: Secondary | ICD-10-CM | POA: Diagnosis not present

## 2020-03-07 DIAGNOSIS — E7800 Pure hypercholesterolemia, unspecified: Secondary | ICD-10-CM | POA: Diagnosis not present

## 2020-03-07 DIAGNOSIS — R079 Chest pain, unspecified: Secondary | ICD-10-CM

## 2020-03-07 NOTE — Patient Instructions (Signed)
Medication Instructions:  No changes   *If you need a refill on your cardiac medications before your next appointment, please call your pharmacy*   Lab Work: None  If you have labs (blood work) drawn today and your tests are completely normal, you will receive your results only by: Marland Kitchen MyChart Message (if you have MyChart) OR . A paper copy in the mail If you have any lab test that is abnormal or we need to change your treatment, we will call you to review the results.   Testing/Procedures: None   Follow-Up: At [facility], you and your health needs are our priority.  As part of our continuing mission to provide you with exceptional heart care, we have created designated Provider Care Teams.  These Care Teams include your primary Cardiologist (physician) and Advanced Practice Providers (APPs -  Physician Assistants and Nurse Practitioners) who all work together to provide you with the care you need, when you need it.   Your next appointment:   Follow up as needed.

## 2020-03-07 NOTE — Progress Notes (Signed)
Cardiology Office Note:    Date:  03/07/2020   ID:  Rebekah Mullins, DOB Aug 24, 1955, MRN [id number]  PCP:  Kathrine Haddock, NP  [facility] Cardiologist:  No primary care provider on file.  [facility] Electrophysiologist:  None   Referring MD: Kathrine Haddock, NP   Chief Complaint  Patient presents with  . office visit    3-4 week F/U-Patient reports"90%" improvement in symptoms since starting Protonix; Meds verbally reviewed with patient.     History of Present Illness:    Rebekah Mullins is a 64 y.o. female with a hx of hypertension, hyperlipidemia, CKD who presents for follow-up.  She was last seen due chest pain.  Echocardiogram and Lexiscan Myoview previously ordered showed normal systolic and diastolic function, no evidence for ischemia.  Symptoms were deemed due to reflux, Protonix was started.  She states her symptoms of chest discomfort has improved dramatically since starting Protonix.  She had one episode couple of days ago when she had dinner and then she got up and move pretty quickly causing some chest discomfort.  Other than that she is okay and has no concerns at this time.    Past Medical History:  Diagnosis Date  . Hyperlipidemia   . Hypertension   . Renal insufficiency   . Vasculitis ([place])   . Wagner syndrome     Past Surgical History:  Procedure Laterality Date  . BREAST EXCISIONAL BIOPSY Right 1975   NEG  . COLONOSCOPY    . COLONOSCOPY WITH PROPOFOL N/A 12/07/2015   Procedure: COLONOSCOPY WITH PROPOFOL;  Surgeon: Christene Lye, MD;  Location: [facility] ENDOSCOPY;  Service: Endoscopy;  Laterality: N/A;  . RENAL BIOPSY  2002, 2005   2 biopsies    Current Medications: Current Meds  Medication Sig  . amLODipine (NORVASC) 10 MG tablet Take 10 mg by mouth daily.  Marland Kitchen aspirin EC 81 MG tablet Take 81 mg by mouth daily.   Marland Kitchen atorvastatin (LIPITOR) 20 MG tablet Take 20 mg by mouth daily.   . calcitRIOL (ROCALTROL) 0.25 MCG capsule Take 0.25 mcg by mouth  daily.  . chlorthalidone (HYGROTON) 25 MG tablet Take 12.5 mg by mouth daily.  . metoprolol tartrate (LOPRESSOR) 25 MG tablet Take 25 mg by mouth 2 (two) times daily.   . Multiple Vitamin (MULTIVITAMIN) tablet Take 2 tablets by mouth daily.   . pantoprazole (PROTONIX) 40 MG tablet Take 1 tablet (40 mg total) by mouth daily.  . ramipril (ALTACE) 10 MG capsule Take 10 mg by mouth 2 (two) times daily.   . valACYclovir (VALTREX) 1000 MG tablet Take 1,000 mg by mouth daily.     Allergies:   Patient has no known allergies.   Social History   Socioeconomic History  . Marital status: Single    Spouse name: Not on file  . Number of children: Not on file  . Years of education: Not on file  . Highest education level: Not on file  Occupational History  . Not on file  Tobacco Use  . Smoking status: Never Smoker  . Smokeless tobacco: Never Used  Vaping Use  . Vaping Use: Never used  Substance and Sexual Activity  . Alcohol use: No  . Drug use: No  . Sexual activity: Yes    Birth control/protection: Post-menopausal  Other Topics Concern  . Not on file  Social History Narrative  . Not on file   Social Determinants of Health   Financial Resource Strain:   . Difficulty of  Paying Living Expenses: Not on file  Food Insecurity:   . Worried About Charity fundraiser in the Last Year: Not on file  . Ran Out of Food in the Last Year: Not on file  Transportation Needs:   . Lack of Transportation (Medical): Not on file  . Lack of Transportation (Non-Medical): Not on file  Physical Activity:   . Days of Exercise per Week: Not on file  . Minutes of Exercise per Session: Not on file  Stress:   . Feeling of Stress : Not on file  Social Connections:   . Frequency of Communication with Friends and Family: Not on file  . Frequency of Social Gatherings with Friends and Family: Not on file  . Attends Religious Services: Not on file  . Active Member of Clubs or Organizations: Not on file  .  Attends Archivist Meetings: Not on file  . Marital Status: Not on file     Family History: The patient's family history includes Cancer in her father and mother; Heart Problems in her maternal grandmother; Kidney disease in her mother. There is no history of Breast cancer.  ROS:   Please see the history of present illness.     All other systems reviewed and are negative.  EKGs/Labs/Other Studies Reviewed:    The following studies were reviewed today:   EKG:  EKG is  ordered today.  The ekg ordered today demonstrates normal sinus rhythm  Recent Labs: No results found for requested labs within last 8760 hours.  Recent Lipid Panel No results found for: CHOL, TRIG, HDL, CHOLHDL, VLDL, LDLCALC, LDLDIRECT  Physical Exam:    VS:  BP 140/90 (BP Location: Left Arm, Patient Position: Sitting, Cuff Size: Large)   Pulse 69   Ht 5\' 7"  (1.702 m)   Wt 214 lb (97.1 kg)   SpO2 95%   BMI 33.52 kg/m     Wt Readings from Last 3 Encounters:  03/07/20 214 lb (97.1 kg)  02/11/20 (!) 218 lb (98.9 kg)  01/11/20 218 lb 2 oz (98.9 kg)     GEN:  Well nourished, well developed in no acute distress HEENT: Normal NECK: No JVD; No carotid bruits LYMPHATICS: No lymphadenopathy CARDIAC: RRR, no murmurs, rubs, gallops RESPIRATORY:  Clear to auscultation without rales, wheezing or rhonchi  ABDOMEN: Soft, non-tender, non-distended MUSCULOSKELETAL:  No edema; No deformity  SKIN: Warm and dry NEUROLOGIC:  Alert and oriented x 3 PSYCHIATRIC:  Normal affect   ASSESSMENT:    1. Chest pain of uncertain etiology   2. Essential hypertension   3. Pure hypercholesterolemia    PLAN:    In order of problems listed above:  1. Patient with history of chest discomfort, symptoms consistent with reflux.  Symptoms have improved with Protonix 40 mg daily.  Continue Protonix as prescribed. 2. History of hypertension, BP reasonably controlled.  Continue amlodipine and enalapril. 3. History of  hyperlipidemia, continue statin.  Sit, will consider increasing ramipril dosage.  Follow-up as needed  Total encounter time 35 minutes  Greater than 50% was spent in counseling and coordination of care with the patient   Medication Adjustments/Labs and Tests Ordered: Current medicines are reviewed at length with the patient today.  Concerns regarding medicines are outlined above.  Orders Placed This Encounter  Procedures  . EKG 12-Lead   No orders of the defined types were placed in this encounter.   Patient Instructions  Medication Instructions:  No changes   *If you need a  refill on your cardiac medications before your next appointment, please call your pharmacy*   Lab Work: None  If you have labs (blood work) drawn today and your tests are completely normal, you will receive your results only by: Marland Kitchen MyChart Message (if you have MyChart) OR . A paper copy in the mail If you have any lab test that is abnormal or we need to change your treatment, we will call you to review the results.   Testing/Procedures: None   Follow-Up: At [facility], you and your health needs are our priority.  As part of our continuing mission to provide you with exceptional heart care, we have created designated Provider Care Teams.  These Care Teams include your primary Cardiologist (physician) and Advanced Practice Providers (APPs -  Physician Assistants and Nurse Practitioners) who all work together to provide you with the care you need, when you need it.   Your next appointment:   Follow up as needed.       Signed, Kate Sable, MD  03/07/2020 12:23 PM    [place]

## 2020-10-03 ENCOUNTER — Other Ambulatory Visit: Payer: Self-pay

## 2020-10-03 MED ORDER — PANTOPRAZOLE SODIUM 40 MG PO TBEC: 40.0000 mg | DELAYED_RELEASE_TABLET | Freq: Every day | ORAL | 3 refills | Status: DC

## 2020-10-03 NOTE — Telephone Encounter (Signed)
*  STAT* If patient is at the pharmacy, call can be transferred to refill team.   1. Which medications need to be refilled? (please list name of each medication and dose if known) Protonix  2. Which pharmacy/location (including street and city if local pharmacy) is medication to be sent to? [facility] [place]  3. Do they need a 30 day or 90 day supply? [facility]

## 2020-11-03 ENCOUNTER — Telehealth: Payer: Self-pay

## 2020-11-03 NOTE — Telephone Encounter (Signed)
Pt would like mammo orders in so when she comes for her annual, AMS will have the results. Pt aware we will let her know when the orders are in,

## 2020-11-07 ENCOUNTER — Other Ambulatory Visit: Payer: Self-pay | Admitting: Obstetrics and Gynecology

## 2020-11-07 DIAGNOSIS — Z1231 Encounter for screening mammogram for malignant neoplasm of breast: Secondary | ICD-10-CM

## 2020-11-07 NOTE — Telephone Encounter (Signed)
Order is in   [facility] [address] [place] [address]  [facility] [place]  [address] [place] [address]  Phone: [phone]  

## 2020-11-08 NOTE — Telephone Encounter (Signed)
Pt aware.

## 2021-01-23 ENCOUNTER — Other Ambulatory Visit (HOSPITAL_COMMUNITY)
Admission: RE | Admit: 2021-01-23 | Discharge: 2021-01-23 | Disposition: A | Discharge status: Home / Self Care | Payer: Medicare PPO | Source: Ambulatory Visit | Attending: Obstetrics and Gynecology | Admitting: Obstetrics and Gynecology

## 2021-01-23 ENCOUNTER — Ambulatory Visit
Admission: RE | Admit: 2021-01-23 | Discharge: 2021-01-23 | Disposition: A | Discharge status: Home / Self Care | Payer: Medicare PPO | Source: Ambulatory Visit | Attending: Obstetrics and Gynecology | Admitting: Obstetrics and Gynecology

## 2021-01-23 ENCOUNTER — Encounter: Payer: Self-pay | Admitting: Obstetrics and Gynecology

## 2021-01-23 ENCOUNTER — Ambulatory Visit (INDEPENDENT_AMBULATORY_CARE_PROVIDER_SITE_OTHER): Payer: Medicare PPO | Admitting: Obstetrics and Gynecology

## 2021-01-23 ENCOUNTER — Other Ambulatory Visit: Payer: Self-pay

## 2021-01-23 VITALS — BP 128/82 | HR 84 | Ht 66.0 in | Wt 218.0 lb

## 2021-01-23 DIAGNOSIS — Z01419 Encounter for gynecological examination (general) (routine) without abnormal findings: Secondary | ICD-10-CM | POA: Diagnosis not present

## 2021-01-23 DIAGNOSIS — Z1151 Encounter for screening for human papillomavirus (HPV): Secondary | ICD-10-CM | POA: Insufficient documentation

## 2021-01-23 DIAGNOSIS — Z1231 Encounter for screening mammogram for malignant neoplasm of breast: Secondary | ICD-10-CM | POA: Insufficient documentation

## 2021-01-23 DIAGNOSIS — Z124 Encounter for screening for malignant neoplasm of cervix: Secondary | ICD-10-CM

## 2021-01-23 DIAGNOSIS — Z1239 Encounter for other screening for malignant neoplasm of breast: Secondary | ICD-10-CM

## 2021-01-23 NOTE — Patient Instructions (Signed)
[facility] [address] [place] [address]  [facility] [place]  [address] [place] [address]  Phone: [phone]  

## 2021-01-23 NOTE — Progress Notes (Signed)
Gynecology Annual Exam  PCP: Kathrine Haddock, NP  Chief Complaint:  Chief Complaint  Patient presents with   Gynecologic Exam    Annual - no concerns. RM 5    History of Present Illness:Patient is a 65 y.o. G4P4 presents for annual exam. The patient has no complaints today.   LMP: No LMP recorded. Patient is postmenopausal.  The patient is sexually active. She denies dyspareunia.  The patient does perform self breast exams.  There is no notable family history of breast or ovarian cancer in her family.  The patient wears seatbelts: yes.   The patient has regular exercise: not asked.    The patient denies current symptoms of depression.     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 IMO Problem List Replacer Jan. 2016      Past Surgical History:  Past Surgical History:  Procedure Laterality Date   BREAST EXCISIONAL BIOPSY Right 1975   NEG   COLONOSCOPY     COLONOSCOPY WITH PROPOFOL N/A 12/07/2015   Procedure: COLONOSCOPY WITH PROPOFOL;  Surgeon: Christene Lye, MD;  Location: [facility] ENDOSCOPY;  Service: Endoscopy;  Laterality: N/A;   RENAL BIOPSY  2002, 2005   2 biopsies    Gynecologic History:  No LMP recorded. Patient is postmenopausal. Last Pap: Results were: 01/01/2020 no  abnormalities  Last mammogram: Results were: 01/21/2020 BI-RAD I  Obstetric History: G4P4  Family History:  Family History  Problem Relation Age of Onset   Cancer Mother        lung   Kidney disease Mother    Cancer Father        bone   Heart Problems Maternal Grandmother    Breast cancer Neg Hx     Social History:  Social History   Socioeconomic History   Marital status: Married    Spouse name: Not on file   Number of children: Not on file   Years of education: Not on file   Highest education level: Not on file  Occupational History   Not on file  Tobacco Use   Smoking status: Never   Smokeless tobacco: Never  Vaping Use   Vaping Use: Never used  Substance and Sexual Activity   Alcohol use: No   Drug use: No   Sexual activity: Yes    Birth control/protection: Post-menopausal  Other Topics Concern   Not on file  Social History Narrative   Not on file   Social Determinants of Health   Financial Resource Strain: Not on file  Food Insecurity: Not on file  Transportation Needs: Not on file  Physical Activity: Not on file  Stress: Not on file  Social Connections: Not on file  Intimate Partner Violence: Not on file    Allergies:  No Known Allergies  Medications: Prior to Admission medications   Medication Sig  Start Date End Date Taking? Authorizing Provider  amLODipine (NORVASC) 10 MG tablet Take 10 mg by mouth daily.   Yes [provider]  aspirin EC 81 MG tablet Take 81 mg by mouth daily.    Yes [provider]  atorvastatin (LIPITOR) 20 MG tablet Take 20 mg by mouth daily.  10/21/17  Yes [provider]  calcitRIOL (ROCALTROL) 0.25 MCG capsule Take 0.25 mcg by mouth daily.   Yes [provider]  chlorthalidone (HYGROTON) 25 MG tablet Take 12.5 mg by mouth daily.   Yes [provider]  cholecalciferol (VITAMIN D3) 25 MCG (1000 UNIT) tablet Take 1,000 Units by mouth daily.   Yes [provider]  metoprolol  tartrate (LOPRESSOR) 25 MG tablet Take 25 mg by mouth 2 (two) times daily.    Yes [provider]  ramipril (ALTACE) 10 MG capsule Take 10 mg by mouth 2 (two) times daily.    Yes [provider]  famotidine (PEPCID) 40 MG tablet Take 40 mg by mouth at bedtime. 09/09/20   [provider]  valACYclovir (VALTREX) 1000 MG tablet Take 1 tablet (1,000 mg total) by mouth 3 (three) times daily. Patient taking differently: Take 1,000 mg by mouth daily.  04/30/16   Kathrine Haddock, NP  valACYclovir (VALTREX) 1000 MG tablet Take 1,000 mg by mouth daily.    [provider]    Physical Exam Vitals: Blood pressure 128/82, pulse 84, height 5\' 6"  (1.676 m), weight 218 lb (98.9 kg).  General: NAD HEENT: normocephalic, anicteric Thyroid: no enlargement, no palpable nodules Pulmonary: No increased work of breathing, CTAB Cardiovascular: RRR, distal pulses 2+ Breast: Breast symmetrical, no tenderness, no palpable nodules or masses, no skin or nipple retraction present, no nipple discharge.  No axillary or supraclavicular lymphadenopathy. Abdomen: NABS, soft, non-tender, non-distended.  Umbilicus without lesions.  No hepatomegaly, splenomegaly or masses palpable. No evidence of hernia  Genitourinary:  External: Normal external female genitalia.  Normal urethral meatus, normal Bartholin's and Skene's glands.    Vagina: Normal vaginal mucosa, no evidence of prolapse.    Cervix: Grossly normal in appearance, no bleeding  Uterus: Non-enlarged, mobile, normal contour.  No CMT  Adnexa: ovaries non-enlarged, no adnexal masses  Rectal: deferred  Lymphatic: no evidence of inguinal lymphadenopathy Extremities: no edema, erythema, or tenderness Neurologic: Grossly intact Psychiatric: mood appropriate, affect full  Female chaperone present for pelvic and breast  portions of the physical exam     Assessment: 65 y.o. G4P4 routine annual exam  Plan: Problem List Items Addressed  This Visit   None Visit Diagnoses     Encounter for gynecological examination without abnormal finding    -  Primary   Breast screening       Breast cancer screening by mammogram       Screening for malignant neoplasm of cervix       Relevant Orders   Cytology - PAP       1) Mammogram - recommend yearly screening mammogram.  Mammogram Was ordered today  2) STI screening  was notoffered and therefore not obtained  3) ASCCP guidelines and rational discussed.  Patient opts for  every 2 year  screening interval  4) Osteoporosis  - per USPTF routine screening DEXA at age 62 - Normal bone density 01/21/2020   5) Routine healthcare maintenance including cholesterol, diabetes screening discussed managed by PCP  6) Colonoscopy up to date last age 73  7) Return in about 1 year (around 01/23/2022) for annual.  Malachy Mood, MD [place] [place], [facility] 01/23/2021, 9:59 AM

## 2021-01-27 LAB — CYTOLOGY - PAP
Comment: NEGATIVE
Diagnosis: NEGATIVE
High risk HPV: NEGATIVE

## 2022-08-11 ENCOUNTER — Ambulatory Visit
Admission: EM | Admit: 2022-08-11 | Discharge: 2022-08-11 | Disposition: A | Discharge status: Home / Self Care | Payer: Medicare PPO | Attending: Family Medicine | Admitting: Family Medicine

## 2022-08-11 ENCOUNTER — Ambulatory Visit (INDEPENDENT_AMBULATORY_CARE_PROVIDER_SITE_OTHER): Payer: Medicare PPO

## 2022-08-11 DIAGNOSIS — M79671 Pain in right foot: Secondary | ICD-10-CM

## 2022-08-11 MED ORDER — ACETAMINOPHEN-CODEINE 300-30 MG PO TABS: 1.0000 | ORAL_TABLET | Freq: Four times a day (QID) | ORAL | 0 refills | Status: AC | PRN

## 2022-08-11 NOTE — Discharge Instructions (Signed)
Stop by your pharmacy to pick up your prescriptions.  Follow up with your orthopedic provider in [place], as discussed.

## 2022-08-11 NOTE — ED Provider Notes (Signed)
[facility]    CSN: [id number] Arrival date & time: 08/11/22  1306      History   Chief Complaint Chief Complaint  Patient presents with   Leg Pain   Foot Pain    HPI  HPI Rebekah Mullins is a 67 y.o. female.   Havah presents for lateral right foot pain after slipping off a curb yesterday. She has a sharp pain that radiates up her leg. She put BioFreeze, Tylenol and applied compresses on it.  She elevated it then went to bed. She woke up this morning unable to walk.  She has slight pain on the side of her leg.  She did not hear any abnormal pops or sounds.  Continues to have pain with walking.  Has not noticed any significant swelling or bruising.     Past Medical History:  Diagnosis Date   Hyperlipidemia    Hypertension    Renal insufficiency    Vasculitis (HCC)    Wagner syndrome     Patient Active Problem List   Diagnosis Date Noted   Vulvovaginitis, estrogen deficient 01/01/2020   Environmental allergies 11/30/2016   Pyuria 11/29/2016   Symptoms of upper respiratory infection (URI) 11/29/2016   Chest pain 08/22/2015   Allergic rhinitis 06/21/2015   Granulomatosis with polyangiitis (HCC) 06/21/2015   Hypertension 06/21/2015   Osteopenia 06/21/2015   Herpes genitalia 10/09/2013   MPA (microscopic polyangiitis) (HCC) 10/09/2013   CKD (chronic kidney disease) stage 3, GFR 30-59 ml/min (HCC) 12/18/2012   Vasculitis (HCC) 12/18/2012   Lower urinary tract infectious disease 12/18/2012    Past Surgical History:  Procedure Laterality Date   BREAST EXCISIONAL BIOPSY Right 1975   NEG   COLONOSCOPY     COLONOSCOPY WITH PROPOFOL N/A 12/07/2015   Procedure: COLONOSCOPY WITH PROPOFOL;  Surgeon: Kieth Brightly, MD;  Location: [facility] ENDOSCOPY;  Service: Endoscopy;  Laterality: N/A;   RENAL BIOPSY  2002, 2005   2 biopsies    OB History     Gravida  4   Para  4   Term      Preterm      AB      Living         SAB      IAB      Ectopic       Multiple      Live Births           Obstetric Comments  1st Menstrual Cycle:  12 1st Pregnancy:  20           Home Medications    Prior to Admission medications   Medication Sig Start Date End Date Taking? Authorizing Provider  acetaminophen-codeine (TYLENOL #3) 300-30 MG tablet Take 1-2 tablets by mouth every 6 (six) hours as needed for moderate pain. 08/11/22  Yes Landon Bassford, DO  amLODipine (NORVASC) 10 MG tablet Take 10 mg by mouth daily.   Yes [provider]  aspirin EC 81 MG tablet Take 81 mg by mouth daily.    Yes [provider]  atorvastatin (LIPITOR) 20 MG tablet Take 20 mg by mouth daily.  10/21/17  Yes [provider]  calcitRIOL (ROCALTROL) 0.25 MCG capsule Take 0.25 mcg by mouth daily.   Yes [provider]  chlorthalidone (HYGROTON) 25 MG tablet Take 12.5 mg by mouth daily.   Yes [provider]  cholecalciferol (VITAMIN D3) 25 MCG (1000 UNIT) tablet Take 1,000 Units by mouth daily.   Yes [provider]  famotidine (PEPCID) 40 MG tablet Take 40 mg by mouth at bedtime. 09/09/20  Yes [provider]  metoprolol tartrate (LOPRESSOR) 25 MG tablet Take 25 mg by mouth 2 (two) times daily.    Yes [provider]  ramipril (ALTACE) 10 MG capsule Take 10 mg by mouth 2 (two) times daily.    Yes [provider]  valACYclovir (VALTREX) 1000 MG tablet Take 1 tablet (1,000 mg total) by mouth 3 (three) times daily. Patient taking differently: Take 1,000 mg by mouth daily. 04/30/16  Yes Gabriel Cirri, NP  valACYclovir (VALTREX) 1000 MG tablet Take 1,000 mg by mouth daily.   Yes [provider]    Family History Family History  Problem Relation Age of Onset   Cancer Mother        lung   Kidney disease Mother    Cancer Father        bone   Heart Problems Maternal Grandmother    Breast cancer Neg Hx     Social History Social History   Tobacco Use   Smoking status: Never    Smokeless tobacco: Never  Vaping Use   Vaping Use: Never used  Substance Use Topics   Alcohol use: No   Drug use: No     Allergies   Patient has no known allergies.   Review of Systems Review of Systems: :negative unless otherwise stated in HPI.      Physical Exam Triage Vital Signs ED Triage Vitals  Enc Vitals Group     BP 08/11/22 1530 138/81     Pulse Rate 08/11/22 1530 88     Resp 08/11/22 1530 16     Temp 08/11/22 1530 97.9 F (36.6 C)     Temp Source 08/11/22 1530 Oral     SpO2 08/11/22 1530 95 %     Weight 08/11/22 1529 220 lb (99.8 kg)     Height 08/11/22 1529 5\' 6"  (1.676 m)     Head Circumference --      Peak Flow --      Pain Score 08/11/22 1528 10     Pain Loc --      Pain Edu? --      Excl. in GC? --    No data found.  Updated Vital Signs BP 138/81 (BP Location: Left Arm)   Pulse 88   Temp 97.9 F (36.6 C) (Oral)   Resp 16   Ht 5\' 6"  (1.676 m)   Wt 99.8 kg   SpO2 95%   BMI 35.51 kg/m   Visual Acuity Right Eye Distance:   Left Eye Distance:   Bilateral Distance:    Right Eye Near:   Left Eye Near:    Bilateral Near:     Physical Exam GEN: well appearing female in no acute distress  CVS: well perfused  RESP: speaking in full sentences without pause, no respiratory distress  MSK:  Ankle/Foot, right: TTP noted at the fifth metatarsal. No appreciable erythema, swelling, ecchymosis, or bony deformity. No notable pes planus/cavus deformity. Transverse arch grossly intact; No evidence of tibiotalar deviation; Range of motion is full in all directions still has mild pain with dorsiflexion eversion. Strength is 4/5 in all directions. No tenderness at the insertion/body/myotendinous junction of the Achilles tendon; No tenderness on posterior aspects of lateral and medial malleolus; Talar dome nontender; Unremarkable calcaneal squeeze; No plantar calcaneal tenderness; No tenderness over the navicular prominence; No tenderness over cuboid    UC  Treatments / Results  Labs (all labs ordered are listed, but only abnormal results are displayed) Labs Reviewed - No data to display  EKG   Radiology DG Foot Complete Right  Result Date: 08/11/2022 CLINICAL DATA:  Pain after injury EXAM: RIGHT FOOT COMPLETE - 3+ VIEW COMPARISON:  None Available. FINDINGS: There is a subtle lucency at the base of the second metatarsal only visualized on 1 image. No other evidence of acute fracture. Enthesopathic changes at the Achilles insertion site on the calcaneus. No other significant abnormalities. IMPRESSION: There is a subtle lucency at the base of the second metatarsal only visualized on 1 image. There is not significant soft tissue swelling in this region based on the lateral view and the finding may be artifactual. A subtle fracture is not completely excluded. Recommend clinical correlation for point tenderness in this region. Electronically Signed   By: Gerome Sam III M.D.   On: 08/11/2022 15:49    Procedures Procedures (including critical care time)  Medications Ordered in UC Medications - No data to display  Initial Impression / Assessment and Plan / UC Course  I have reviewed the triage vital signs and the nursing notes.  Pertinent labs & imaging results that were available during my care of the patient were reviewed by me and considered in my medical decision making (see chart for details).      Pt is a 67 y.o.  female with 1 day of right foot pain after slipping off a curb.  On exam, she has extensive tenderness along the lateral edge of her right foot.  Pain specifically intense at the base of the fifth metatarsal.  Obtained right foot x-rays in the radiology saw a questionable fracture at the base of the second metatarsal which is on the opposite side of where her pain is.  She was given crutches and a cam boot.  She is going back to [place] soon and has an orthopedic provider there that she will follow-up with within the next week..     Patient to gradually return to normal activities, as tolerated and continue ordinary activities within the limits permitted by pain. Prescribed Tylenol 3 for pain relief.  Patient has CKD 4 therefore avoiding NSAIDs.  Return and ED precautions given. Understanding voiced. Discussed MDM, treatment plan and plan for follow-up with patient/husband who agree with plan.   Final Clinical Impressions(s) / UC Diagnoses   Final diagnoses:  Right foot pain     Discharge Instructions      Stop by your pharmacy to pick up your prescriptions.  Follow up with your orthopedic provider in [place], as discussed.     ED Prescriptions     Medication Sig Dispense Auth. Provider   acetaminophen-codeine (TYLENOL #3) 300-30 MG tablet Take 1-2 tablets by mouth every 6 (six) hours as needed for moderate pain. 15 tablet Dusty Raczkowski, DO      I have reviewed the PDMP during this encounter.   Katha Cabal, DO 08/12/22 0227

## 2022-08-11 NOTE — ED Triage Notes (Signed)
Pt c/o R leg & foot pain/edema x1 day, states she [place] curb[place].
